# Patient Record
Sex: Male | Born: 1960
Health system: Southern US, Community
[De-identification: ages and names within clinical notes are randomized; demographics above are authoritative.]

## PROBLEM LIST (undated history)

## (undated) DIAGNOSIS — Z8711 Personal history of peptic ulcer disease: Secondary | ICD-10-CM

## (undated) DIAGNOSIS — Q2381 Bicuspid aortic valve: Secondary | ICD-10-CM

## (undated) DIAGNOSIS — K219 Gastro-esophageal reflux disease without esophagitis: Secondary | ICD-10-CM

## (undated) DIAGNOSIS — I10 Essential (primary) hypertension: Secondary | ICD-10-CM

## (undated) DIAGNOSIS — Q231 Congenital insufficiency of aortic valve: Secondary | ICD-10-CM

## (undated) DIAGNOSIS — R197 Diarrhea, unspecified: Secondary | ICD-10-CM

## (undated) DIAGNOSIS — M1990 Unspecified osteoarthritis, unspecified site: Secondary | ICD-10-CM

## (undated) DIAGNOSIS — J449 Chronic obstructive pulmonary disease, unspecified: Secondary | ICD-10-CM

## (undated) DIAGNOSIS — R112 Nausea with vomiting, unspecified: Secondary | ICD-10-CM

## (undated) DIAGNOSIS — I456 Pre-excitation syndrome: Secondary | ICD-10-CM

## (undated) DIAGNOSIS — Z9889 Other specified postprocedural states: Secondary | ICD-10-CM

## (undated) DIAGNOSIS — B079 Viral wart, unspecified: Secondary | ICD-10-CM

## (undated) DIAGNOSIS — Z8719 Personal history of other diseases of the digestive system: Secondary | ICD-10-CM

## (undated) DIAGNOSIS — R918 Other nonspecific abnormal finding of lung field: Secondary | ICD-10-CM

## (undated) DIAGNOSIS — M1731 Unilateral post-traumatic osteoarthritis, right knee: Principal | ICD-10-CM

## (undated) DIAGNOSIS — Z87442 Personal history of urinary calculi: Secondary | ICD-10-CM

## (undated) HISTORY — PX: KNEE ARTHROSCOPY: SHX127

## (undated) HISTORY — PX: CERVICAL SPINE SURGERY: SHX589

## (undated) HISTORY — PX: CARDIAC ELECTROPHYSIOLOGY MAPPING AND ABLATION: SHX1292

## (undated) HISTORY — PX: HERNIA REPAIR: SHX51

## (undated) HISTORY — PX: ANTERIOR CERVICAL DECOMP/DISCECTOMY FUSION: SHX1161

## (undated) HISTORY — PX: BACK SURGERY: SHX140

## (undated) HISTORY — PX: JOINT REPLACEMENT: SHX530

## (undated) HISTORY — PX: LAPAROSCOPIC CHOLECYSTECTOMY: SUR755

## (undated) HISTORY — PX: SHOULDER ARTHROSCOPY: SHX128

## (undated) HISTORY — PX: CYSTOSCOPY WITH URETEROSCOPY, STONE BASKETRY AND STENT PLACEMENT: SHX6378

## (undated) HISTORY — PX: APPENDECTOMY: SHX54

---

## 2004-03-14 ENCOUNTER — Encounter: Admission: RE | Admit: 2004-03-14 | Discharge: 2004-03-14 | Payer: Self-pay | Admitting: Orthopedic Surgery

## 2009-02-27 ENCOUNTER — Emergency Department (HOSPITAL_BASED_OUTPATIENT_CLINIC_OR_DEPARTMENT_OTHER): Admission: EM | Admit: 2009-02-27 | Discharge: 2009-02-27 | Payer: Self-pay | Admitting: Emergency Medicine

## 2009-02-27 ENCOUNTER — Ambulatory Visit: Payer: Self-pay | Admitting: Radiology

## 2009-08-21 ENCOUNTER — Emergency Department (HOSPITAL_BASED_OUTPATIENT_CLINIC_OR_DEPARTMENT_OTHER): Admission: EM | Admit: 2009-08-21 | Discharge: 2009-08-21 | Payer: Self-pay | Admitting: Emergency Medicine

## 2009-08-21 ENCOUNTER — Ambulatory Visit: Payer: Self-pay | Admitting: Diagnostic Radiology

## 2010-06-24 LAB — CBC
HCT: 49.7 % (ref 39.0–52.0)
Hemoglobin: 17.2 g/dL — ABNORMAL HIGH (ref 13.0–17.0)
MCHC: 34.7 g/dL (ref 30.0–36.0)
MCV: 95.1 fL (ref 78.0–100.0)
Platelets: 241 10*3/uL (ref 150–400)
RBC: 5.22 MIL/uL (ref 4.22–5.81)
RDW: 12.5 % (ref 11.5–15.5)
WBC: 8.6 10*3/uL (ref 4.0–10.5)

## 2010-06-24 LAB — DIFFERENTIAL
Basophils Absolute: 0.1 10*3/uL (ref 0.0–0.1)
Basophils Relative: 1 % (ref 0–1)
Eosinophils Absolute: 0.1 10*3/uL (ref 0.0–0.7)
Eosinophils Relative: 1 % (ref 0–5)
Lymphocytes Relative: 24 % (ref 12–46)
Lymphs Abs: 2.1 10*3/uL (ref 0.7–4.0)
Monocytes Absolute: 0.8 10*3/uL (ref 0.1–1.0)
Monocytes Relative: 9 % (ref 3–12)
Neutro Abs: 5.5 10*3/uL (ref 1.7–7.7)
Neutrophils Relative %: 64 % (ref 43–77)

## 2010-06-24 LAB — PROTIME-INR
INR: 0.98 (ref 0.00–1.49)
Prothrombin Time: 12.9 seconds (ref 11.6–15.2)

## 2010-06-24 LAB — COMPREHENSIVE METABOLIC PANEL
ALT: 36 U/L (ref 0–53)
AST: 26 U/L (ref 0–37)
Albumin: 4.9 g/dL (ref 3.5–5.2)
Alkaline Phosphatase: 82 U/L (ref 39–117)
BUN: 17 mg/dL (ref 6–23)
CO2: 26 mEq/L (ref 19–32)
Calcium: 9.6 mg/dL (ref 8.4–10.5)
Chloride: 105 mEq/L (ref 96–112)
Creatinine, Ser: 1.3 mg/dL (ref 0.4–1.5)
GFR calc Af Amer: >60 mL/min (ref >60)
GFR calc non Af Amer: 59 mL/min — ABNORMAL LOW (ref >60)
Glucose, Bld: 107 mg/dL — ABNORMAL HIGH (ref 70–99)
Potassium: 4.1 mEq/L (ref 3.5–5.1)
Sodium: 146 mEq/L — ABNORMAL HIGH (ref 135–145)
Total Bilirubin: 1.6 mg/dL — ABNORMAL HIGH (ref 0.3–1.2)
Total Protein: 8.6 g/dL — ABNORMAL HIGH (ref 6.0–8.3)

## 2010-06-24 LAB — POCT CARDIAC MARKERS
CKMB, poc: 1 ng/mL (ref 1.0–8.0)
CKMB, poc: 1.6 ng/mL (ref 1.0–8.0)
Myoglobin, poc: 108 ng/mL (ref 12–200)
Myoglobin, poc: 97.8 ng/mL (ref 12–200)
Troponin i, poc: <0.05 ng/mL (ref 0.00–0.09)
Troponin i, poc: <0.05 ng/mL (ref 0.00–0.09)

## 2010-06-24 LAB — APTT: aPTT: 29 seconds (ref 24–37)

## 2014-06-07 ENCOUNTER — Institutional Professional Consult (permissible substitution): Payer: Self-pay | Admitting: Internal Medicine

## 2015-07-29 ENCOUNTER — Ambulatory Visit (HOSPITAL_COMMUNITY)
Admission: RE | Admit: 2015-07-29 | Discharge: 2015-07-29 | Disposition: A | Discharge status: Home / Self Care | Payer: BLUE CROSS/BLUE SHIELD | Source: Ambulatory Visit | Attending: Orthopedic Surgery | Admitting: Orthopedic Surgery

## 2015-07-29 ENCOUNTER — Other Ambulatory Visit (HOSPITAL_COMMUNITY): Payer: Self-pay | Admitting: Orthopedic Surgery

## 2015-07-29 DIAGNOSIS — M79604 Pain in right leg: Secondary | ICD-10-CM | POA: Diagnosis present

## 2015-07-29 DIAGNOSIS — R609 Edema, unspecified: Secondary | ICD-10-CM

## 2015-07-29 DIAGNOSIS — M7989 Other specified soft tissue disorders: Secondary | ICD-10-CM | POA: Diagnosis not present

## 2015-07-29 NOTE — Progress Notes (Signed)
VASCULAR LAB PRELIMINARY  PRELIMINARY  PRELIMINARY  PRELIMINARY  Right lower extremity venous duplex completed.    Preliminary report:  Right:  No evidence of DVT, superficial thrombosis, or Baker's cyst.  Adam Watson, RVT 07/29/2015, 5:11 PM

## 2016-04-07 ENCOUNTER — Encounter (HOSPITAL_COMMUNITY): Payer: Self-pay

## 2016-04-07 ENCOUNTER — Encounter (HOSPITAL_COMMUNITY)
Admission: RE | Admit: 2016-04-07 | Discharge: 2016-04-07 | Disposition: A | Discharge status: Home / Self Care | Payer: BLUE CROSS/BLUE SHIELD | Source: Ambulatory Visit | Attending: Orthopedic Surgery | Admitting: Orthopedic Surgery

## 2016-04-07 ENCOUNTER — Encounter: Payer: Self-pay | Admitting: Physician Assistant

## 2016-04-07 DIAGNOSIS — I252 Old myocardial infarction: Secondary | ICD-10-CM | POA: Diagnosis not present

## 2016-04-07 DIAGNOSIS — J449 Chronic obstructive pulmonary disease, unspecified: Secondary | ICD-10-CM | POA: Diagnosis present

## 2016-04-07 DIAGNOSIS — R918 Other nonspecific abnormal finding of lung field: Secondary | ICD-10-CM | POA: Diagnosis not present

## 2016-04-07 DIAGNOSIS — I10 Essential (primary) hypertension: Secondary | ICD-10-CM | POA: Insufficient documentation

## 2016-04-07 DIAGNOSIS — I498 Other specified cardiac arrhythmias: Secondary | ICD-10-CM | POA: Insufficient documentation

## 2016-04-07 DIAGNOSIS — Z01812 Encounter for preprocedural laboratory examination: Secondary | ICD-10-CM | POA: Diagnosis not present

## 2016-04-07 DIAGNOSIS — M1731 Unilateral post-traumatic osteoarthritis, right knee: Secondary | ICD-10-CM | POA: Insufficient documentation

## 2016-04-07 DIAGNOSIS — K219 Gastro-esophageal reflux disease without esophagitis: Secondary | ICD-10-CM | POA: Insufficient documentation

## 2016-04-07 DIAGNOSIS — Z01810 Encounter for preprocedural cardiovascular examination: Secondary | ICD-10-CM | POA: Insufficient documentation

## 2016-04-07 DIAGNOSIS — R112 Nausea with vomiting, unspecified: Secondary | ICD-10-CM | POA: Insufficient documentation

## 2016-04-07 DIAGNOSIS — Z9889 Other specified postprocedural states: Secondary | ICD-10-CM | POA: Diagnosis present

## 2016-04-07 HISTORY — DX: Essential (primary) hypertension: I10

## 2016-04-07 HISTORY — DX: Other specified postprocedural states: Z98.890

## 2016-04-07 HISTORY — DX: Chronic obstructive pulmonary disease, unspecified: J44.9

## 2016-04-07 HISTORY — DX: Unspecified osteoarthritis, unspecified site: M19.90

## 2016-04-07 HISTORY — DX: Diarrhea, unspecified: R19.7

## 2016-04-07 HISTORY — DX: Gastro-esophageal reflux disease without esophagitis: K21.9

## 2016-04-07 HISTORY — DX: Other nonspecific abnormal finding of lung field: R91.8

## 2016-04-07 HISTORY — DX: Unilateral post-traumatic osteoarthritis, right knee: M17.31

## 2016-04-07 HISTORY — DX: Nausea with vomiting, unspecified: R11.2

## 2016-04-07 LAB — CBC
HCT: 45.9 % (ref 39.0–52.0)
Hemoglobin: 16.2 g/dL (ref 13.0–17.0)
MCH: 32.5 pg (ref 26.0–34.0)
MCHC: 35.3 g/dL (ref 30.0–36.0)
MCV: 92.2 fL (ref 78.0–100.0)
Platelets: 284 10*3/uL (ref 150–400)
RBC: 4.98 MIL/uL (ref 4.22–5.81)
RDW: 13.5 % (ref 11.5–15.5)
WBC: 6.9 10*3/uL (ref 4.0–10.5)

## 2016-04-07 LAB — BASIC METABOLIC PANEL
Anion gap: 8 (ref 5–15)
BUN: 10 mg/dL (ref 6–20)
CO2: 26 mmol/L (ref 22–32)
Calcium: 9.6 mg/dL (ref 8.9–10.3)
Chloride: 105 mmol/L (ref 101–111)
Creatinine, Ser: 1.08 mg/dL (ref 0.61–1.24)
GFR calc Af Amer: >60 mL/min (ref >60)
GFR calc non Af Amer: >60 mL/min (ref >60)
Glucose, Bld: 108 mg/dL — ABNORMAL HIGH (ref 65–99)
Potassium: 3.9 mmol/L (ref 3.5–5.1)
Sodium: 139 mmol/L (ref 135–145)

## 2016-04-07 LAB — SURGICAL PCR SCREEN
MRSA, PCR: NEGATIVE
Staphylococcus aureus: NEGATIVE

## 2016-04-07 NOTE — Progress Notes (Signed)
(  disregard previous note)

## 2016-04-07 NOTE — Pre-Procedure Instructions (Signed)
    Adam Watson  04/07/2016     No Pharmacies Listed   Your procedure is scheduled on 04/13/16.  [facility] at 530 A.M.  Call this number if you have problems the morning of surgery:  [phone]   Remember:  Do not eat food or drink liquids after midnight.  Take these medicines the morning of surgery with A SIP OF WATER --    Do not wear jewelry, make-up or nail polish.  Do not wear lotions, powders, or perfumes, or deoderant.  Do not shave 48 hours prior to surgery.  Men may shave face and neck.  Do not bring valuables to the hospital.  [facility] is not responsible for any belongings or valuables.  Contacts, dentures or bridgework may not be worn into surgery.  Leave your suitcase in the car.  After surgery it may be brought to your room.  For patients admitted to the hospital, discharge time will be determined by your treatment team.  Patients discharged the day of surgery will not be allowed to drive home.   Name and phone number of your driver:   Special instructions: Do not take any aspirin,anti-inflammatories,vitamins,or herbal supplements 5-7 days prior to surgery.  Please read over the following fact sheets that you were given. MRSA Information

## 2016-04-07 NOTE — Progress Notes (Signed)
Pt is going to call Dr Marquis BuggyVang [place] to report elevated BP. 134/106

## 2016-04-07 NOTE — H&P (Signed)
TOTAL KNEE ADMISSION H&P  Patient is being admitted for right total knee arthroplasty.  Subjective:  Chief Complaint:right knee pain.  HPI [name] is a 56 year-old seen for follow up evaluation from his persistent significant right knee pain.  He is 7 months status post repeat right knee arthroscopy for partial medial meniscectomy.  He continues to have significant pain medially.  He underwent an IME evaluation by Dr. Beverely Low on February 11, 2016 who felt that he may be a candidate for a unicompartmental versus total knee arthroplasty due to his posttraumatic osteoarthritis.  It has been difficult for him to work and he has continued to have pain.     Adam Watson, 56 y.o. male, has a history of pain and functional disability in the right knee due to trauma and has failed non-surgical conservative treatments for greater than 12 weeks to includeNSAID's and/or analgesics, corticosteriod injections, viscosupplementation injections, flexibility and strengthening excercises, supervised PT with diminished ADL's post treatment, use of assistive devices and activity modification.  Onset of symptoms was abrupt, starting 1 years ago with rapidlly worsening course since that time. The patient noted prior procedures on the knee to include  arthroscopy and menisectomy on the right knee(s).  Patient currently rates pain in the right knee(s) at 10 out of 10 with activity. Patient has night pain, worsening of pain with activity and weight bearing, pain that interferes with activities of daily living, crepitus and joint swelling.  Patient has evidence of subchondral sclerosis, periarticular osteophytes and joint space narrowing by imaging studies.There is no active infection.  Patient Active Problem List   Diagnosis Date Noted  . Post-traumatic osteoarthritis of right knee 04/07/2016  . Pulmonary nodules   . PONV (postoperative nausea and vomiting)   . Hypertension   . GERD (gastroesophageal reflux disease)    . COPD (chronic obstructive pulmonary disease) (HCC)    Past Medical History:  Diagnosis Date  . Arthritis   . COPD (chronic obstructive pulmonary disease) (HCC)   . Diarrhea    hx  . GERD (gastroesophageal reflux disease)   . Hypertension   . Lesion of ala of nose   . PONV (postoperative nausea and vomiting)   . Post-traumatic osteoarthritis of right knee 04/07/2016  . Pulmonary nodules     Past Surgical History:  Procedure Laterality Date  . APPENDECTOMY    . BACK SURGERY     X2     . CHOLECYSTECTOMY    . JOINT REPLACEMENT     scopes x2    No current facility-administered medications for this encounter.   Current Outpatient Prescriptions:  .  losartan-hydrochlorothiazide (HYZAAR) 50-12.5 MG tablet, Take 1 tablet by mouth daily., Disp: , Rfl:  .  Multiple Vitamins-Minerals (MULTIVITAMIN PO), Take 1 tablet by mouth daily., Disp: , Rfl:  .  mupirocin ointment (BACTROBAN) 2 %, Place 1 application into the nose 2 (two) times daily., Disp: , Rfl: 0 .  omeprazole (PRILOSEC) 40 MG capsule, Take 40 mg by mouth daily., Disp: , Rfl: 2 .  potassium chloride (K-DUR,KLOR-CON) 10 MEQ tablet, Take 10 mEq by mouth 2 (two) times daily., Disp: , Rfl:     Allergies  Allergen Reactions  . Codeine Other (See Comments)    Eyes burning  . Penicillins Hives and Rash    Social History  Substance Use Topics  . Smoking status: Never Smoker  . Smokeless tobacco: Not on file  . Alcohol use No    No family history on file.  Review of Systems  Constitutional: Negative.   HENT: Negative.   Eyes: Negative.   Respiratory: Negative.   Cardiovascular: Negative.   Gastrointestinal: Negative.   Genitourinary: Negative.   Musculoskeletal: Positive for back pain and joint pain.  Skin: Negative.   Neurological: Negative.   Endo/Heme/Allergies: Negative.   Psychiatric/Behavioral: Negative.     Objective:  Physical Exam  Constitutional: He is oriented to person, place, and time. He appears  well-developed and well-nourished.  HENT:  Head: Normocephalic and atraumatic.  Mouth/Throat: Oropharynx is clear and moist.  Eyes: Conjunctivae are normal. Pupils are equal, round, and reactive to light.  Neck: Neck supple.  Cardiovascular: Normal rate and regular rhythm.   Respiratory: Effort normal and breath sounds normal.  GI: Soft. Bowel sounds are normal.  Genitourinary:  Genitourinary Comments: Not pertinent to current symptomatology therefore not examined.  Musculoskeletal:  Examination of his right knee reveals pain medially.  1+ synovitis.  Well healed arthroscopic portals.  Range of motion is from 0-120 degrees.  Knee is stable with normal patella tracking.  Examination of the left knee reveals full range of motion without pain, swelling, weakness or instability.    Neurological: He is alert and oriented to person, place, and time.  Skin: Skin is warm and dry.  Psychiatric: He has a normal mood and affect. His behavior is normal.    Vital signs in last 24 hours: Temp:  [98.5 F (36.9 C)] 98.5 F (36.9 C) (01/16 0958) Pulse Rate:  [100] 100 (01/16 0958) Resp:  [18] 18 (01/16 0958) BP: (134)/(96) 134/96 (01/16 0958) SpO2:  [96 %] 96 % (01/16 0958) Weight:  [117.2 kg (258 lb 5 oz)] 117.2 kg (258 lb 5 oz) (01/16 0958)  Labs:   Estimated body mass index is 34.08 kg/m as calculated from the following:   Height as of 04/07/16: 6\' 1"  (1.854 m).   Weight as of 04/07/16: 117.2 kg (258 lb 5 oz).   Imaging Review Plain radiographs demonstrate severe degenerative joint disease of the right knee(s). The overall alignment issignificant varus. The bone quality appears to be good for age and reported activity level.  Assessment/Plan:  End stage arthritis, right knee  Principal Problem:   Post-traumatic osteoarthritis of right knee Active Problems:   Pulmonary nodules   PONV (postoperative nausea and vomiting)   Hypertension   GERD (gastroesophageal reflux disease)   COPD  (chronic obstructive pulmonary disease) (HCC)   The patient history, physical examination, clinical judgment of the provider and imaging studies are consistent with end stage degenerative joint disease of the right knee(s) and total knee arthroplasty is deemed medically necessary. The treatment options including medical management, injection therapy arthroscopy and arthroplasty were discussed at length. The risks and benefits of total knee arthroplasty were presented and reviewed. The risks due to aseptic loosening, infection, stiffness, patella tracking problems, thromboembolic complications and other imponderables were discussed. The patient acknowledged the explanation, agreed to proceed with the plan and consent was signed. Patient is being admitted for inpatient treatment for surgery, pain control, PT, OT, prophylactic antibiotics, VTE prophylaxis, progressive ambulation and ADL's and discharge planning. The patient is planning to be discharged home with home health services

## 2016-04-09 ENCOUNTER — Encounter (HOSPITAL_COMMUNITY): Payer: Self-pay | Admitting: Emergency Medicine

## 2016-04-09 NOTE — Progress Notes (Addendum)
Anesthesia Chart Review:  Pt is a 56 year old male scheduled for R unicompartmental knee on 04/13/2016 with Salvatore Marvelobert Wainer, MD.   - PCP is Derek JackAldene Hawks, MD who cleared pt for surgery at last office visit 04/02/16 (notes in care everywhere) - Sees Lysle PearlJustin Blaylock, PA with pulmonology (notes in care everywhere)  PMH includes:  HTN, COPD, pulmonary nodules, post-op N/V, GERD.  Never smoker. BMI 34  Medications include: losartan-hctz, prilosec, potassium  Preoperative labs reviewed.    EKG 04/07/16: NSR with sinus arrhythmia. Inferior infarct, age undetermined.  Appears stable when compared to EKG 02/27/09.   Per Dr. Sherene SiresWainer's office, pt had ablation for WPW ~15 years ago, has not seen cardiology since. Pt also reports he had stress test/cardiac work up with PCP ~1.5 years ago. Attempting to get records.   Rica Mastngela Carla Rashad, FNP-BC [facility]Anesthesiology Phone: [phone] 04/09/2016 4:12 PM  Addendum:  Neither PCP's current office nor [facility] (PCP's prior place of work) have records of pt undergoing any cardiac testing.    Most recently dated progress note from [facility] dated 12/22/10 documents pt has aortic stenosis with bicuspid valve and WP[facility].    I spoke with Genelle BalKirsten Shepperson, PA in Dr. Sherene SiresWainer's office. Given significant cardiac history that appears to be not to be followed by cardiology, pt will need cardiology eval prior to surgery.   Rica Mastngela Hillman Attig, FNP-BC [facility] Phone: [phone] 04/10/2016 2:56 PM

## 2016-04-10 MED ORDER — VANCOMYCIN HCL 10 G IV SOLR
1500.0000 mg | INTRAVENOUS | Status: DC
  Filled 2016-04-10: qty 1500

## 2016-04-11 NOTE — Progress Notes (Signed)
Cardiology Office Note   Date:  04/13/2016   ID:  Adam MoraleJohn R Poss, DOB 03/16/1961, MRN [id number]  PCP:  No PCP Per Patient  Cardiologist:   Rollene RotundaJames Timaya Bojarski, MD  Referring:  Dr. Thurston HoleWainer  Chief Complaint  Patient presents with  . Pre-op Exam  . Aortic Valve Disease      History of Present Illness: Adam Watson is a 56 y.o. male who presents for preop clearance prior to knee surgery.   A progress note from [facility] dated 12/22/10 documents pt has aortic stenosis with bicuspid valve and WPW history [facility].  He confirms this.  He says he was getting echos every five years.  He had problems with his BP and had a stress test two years ago.  He also had an echo.  These apparently were both unremarkable except for a documented bicuspid aortic valve.  The patient denies any new symptoms such as chest discomfort, neck or arm discomfort. There has been no new shortness of breath, PND or orthopnea. There have been no reported palpitations, presyncope or syncope.  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     Past Medical History:  Diagnosis Date  . Arthritis   . COPD (chronic obstructive pulmonary disease) (HCC)   . Diarrhea    hx  . GERD (gastroesophageal reflux disease)   . Hypertension    no meds  . Lesion of ala of nose   . PONV (postoperative nausea and vomiting)   . Post-traumatic osteoarthritis of right knee 04/07/2016  . Pulmonary nodules     Past Surgical History:  Procedure Laterality Date  . APPENDECTOMY    . CHOLECYSTECTOMY    . Knee pain     scopes x2  . NECK SURGERY     X2        Current Outpatient Prescriptions  Medication Sig Dispense Refill  . losartan-hydrochlorothiazide (HYZAAR) 50-12.5 MG tablet Take 1 tablet by mouth daily.    . Multiple Vitamins-Minerals (MULTIVITAMIN PO) Take 1 tablet by mouth daily.    . mupirocin ointment (BACTROBAN) 2 % Place 1 application into the nose 2 (two) times daily.  0  .  omeprazole (PRILOSEC) 40 MG capsule Take 40 mg by mouth daily.  2  . potassium chloride (K-DUR,KLOR-CON) 10 MEQ tablet Take 10 mEq by mouth 2 (two) times daily.     No current facility-administered medications for this visit.     Allergies:   Codeine and Penicillins    Social History:  The patient  reports that he has never smoked. He has never used smokeless tobacco. He reports that he does not drink alcohol or use drugs.   Family History:  The patient's family history includes Cancer in his father; Diverticulitis in his brother; Heart attack in his brother; Hypertension in his brother and mother; Kidney failure in his mother; Lupus in his mother.    ROS:  Please see the history of present illness.   Otherwise, review of systems are positive for none.   All other systems are reviewed and negative.    PHYSICAL EXAM: VS:  BP 138/88 (BP Location: Left Arm, Patient Position: Sitting, Cuff Size: Large)   Pulse (!) 106   Ht 6\' 1"  (1.854 m)   Wt 260 lb (117.9 kg)   BMI 34.30 kg/m  , BMI Body mass index is 34.3 kg/m. GENERAL:  Well appearing HEENT:  Pupils equal round and reactive, fundi  not visualized, oral mucosa unremarkable NECK:  No jugular venous distention, waveform within normal limits, carotid upstroke brisk and symmetric, no bruits, no thyromegaly LYMPHATICS:  No cervical, inguinal adenopathy LUNGS:  Clear to auscultation bilaterally BACK:  No CVA tenderness CHEST:  Unremarkable HEART:  PMI not displaced or sustained,S1 and S2 within normal limits, no S3, no S4, no clicks, no rubs, soft apical early peaking nonradiating systolic murmur, no diastolic murmurs ABD:  Flat, positive bowel sounds normal in frequency in pitch, no bruits, no rebound, no guarding, no midline pulsatile mass, no hepatomegaly, no splenomegaly EXT:  2 plus pulses throughout, no edema, no cyanosis no clubbing SKIN:  No rashes no nodules NEURO:  Cranial nerves II through XII grossly intact, motor grossly  intact throughout PSYCH:  Cognitively intact, oriented to person place and time   EKG:  EKG is ordered today. The ekg ordered today demonstrates sinus tachycardia, rate 106, axis within normal limits, intervals within normal limits, low voltage borderline on the chest leads, poor anterior R wave progression   Recent Labs: 04/07/2016: BUN 10; Creatinine, Ser 1.08; Hemoglobin 16.2; Platelets 284; Potassium 3.9; Sodium 139    Lipid Panel No results found for: CHOL, TRIG, HDL, CHOLHDL, VLDL, LDLCALC, LDLDIRECT    Wt Readings from Last 3 Encounters:  04/13/16 260 lb (117.9 kg)  04/07/16 258 lb 5 oz (117.2 kg)      Other studies Reviewed: Additional studies/ records that were reviewed today include: Care Everywhere records. . Review of the above records demonstrates:  Please see elsewhere in the note.     ASSESSMENT AND PLAN:   PREOP CLEARANCE:    I would like to see the echocardiogram and stress test that he had done in 2016 at [facility].  However, we were not able to find these and the patient was not absolutely sure that they were done.  Therefore, he will need a repeat echo prior to cardiology clearance.   WPW:  He has had no further palpitations.  No further therapy is indicated.    BICUSPID AORTIC VALVE:     He has no symptoms related to this. We talked at length about the physiology. I don't suspect he'll need any further imaging but I'll see him back in a couple of years to follow this clinically.    OBESITY:   We had a long discussion about this with therapeutic lifestyle changes.  Current medicines are reviewed at length with the patient today.  The patient does not have concerns regarding medicines.  The following changes have been made:  no change  Labs/ tests ordered today include:   Orders Placed This Encounter  Procedures  . EKG 12-Lead  . ECHOCARDIOGRAM COMPLETE     Disposition:   FU with me in two years with an echo.     Signed, Rollene Rotunda, MD    04/13/2016 12:39 PM    [facility]

## 2016-04-13 ENCOUNTER — Encounter: Payer: Self-pay | Admitting: Cardiology

## 2016-04-13 ENCOUNTER — Encounter (HOSPITAL_COMMUNITY): Admission: RE | Payer: Self-pay | Source: Ambulatory Visit

## 2016-04-13 ENCOUNTER — Ambulatory Visit (INDEPENDENT_AMBULATORY_CARE_PROVIDER_SITE_OTHER): Payer: Worker's Compensation | Admitting: Cardiology

## 2016-04-13 ENCOUNTER — Inpatient Hospital Stay (HOSPITAL_COMMUNITY)
Admission: RE | Admit: 2016-04-13 | Payer: Worker's Compensation | Source: Ambulatory Visit | Admitting: Orthopedic Surgery

## 2016-04-13 VITALS — BP 138/88 | HR 106 | Ht 73.0 in | Wt 260.0 lb

## 2016-04-13 DIAGNOSIS — Z01818 Encounter for other preprocedural examination: Secondary | ICD-10-CM

## 2016-04-13 DIAGNOSIS — Q231 Congenital insufficiency of aortic valve: Secondary | ICD-10-CM | POA: Diagnosis not present

## 2016-04-13 DIAGNOSIS — I456 Pre-excitation syndrome: Secondary | ICD-10-CM | POA: Diagnosis not present

## 2016-04-13 HISTORY — DX: Unilateral post-traumatic osteoarthritis, right knee: M17.31

## 2016-04-13 SURGERY — ARTHROPLASTY, KNEE, UNICOMPARTMENTAL
Anesthesia: General | Laterality: Right

## 2016-04-13 NOTE — Patient Instructions (Addendum)
Medication Instructions:  Continue current medications  Labwork: None Ordered  Testing/Procedures: Your physician has requested that you have an echocardiogram. Echocardiography is a painless test that uses sound waves to create images of your heart. It provides your doctor with information about the size and shape of your heart and how well your heart's chambers and valves are working. This procedure takes approximately one hour. There are no restrictions for this procedure.   Follow-Up: Your physician wants you to follow-up in: 2 Year. You will receive a reminder letter in the mail two months in advance. If you don't receive a letter, please call our office to schedule the follow-up appointment.   Any Other Special Instructions Will Be Listed Below (If Applicable).   If you need a refill on your cardiac medications before your next appointment, please call your pharmacy.   

## 2016-04-15 ENCOUNTER — Ambulatory Visit (HOSPITAL_COMMUNITY): Payer: Worker's Compensation | Attending: Cardiology

## 2016-04-15 ENCOUNTER — Other Ambulatory Visit: Payer: Self-pay

## 2016-04-15 DIAGNOSIS — J449 Chronic obstructive pulmonary disease, unspecified: Secondary | ICD-10-CM | POA: Diagnosis not present

## 2016-04-15 DIAGNOSIS — Z01818 Encounter for other preprocedural examination: Secondary | ICD-10-CM

## 2016-04-15 DIAGNOSIS — I10 Essential (primary) hypertension: Secondary | ICD-10-CM | POA: Diagnosis not present

## 2016-04-15 DIAGNOSIS — Q231 Congenital insufficiency of aortic valve: Secondary | ICD-10-CM | POA: Diagnosis not present

## 2016-04-15 DIAGNOSIS — I071 Rheumatic tricuspid insufficiency: Secondary | ICD-10-CM | POA: Insufficient documentation

## 2016-04-15 NOTE — H&P (Signed)
TOTAL KNEE ADMISSION H&P  Patient is being admitted for right knee arthroscopy uni vs total knee arthroplasty.  Subjective:  Chief Complaint:right knee pain.  HPI Mr. Kristen LoaderFurr is a 56 year-old seen for follow up evaluation from his persistent significant right knee pain.  He is 7 months status post repeat right knee arthroscopy for partial medial meniscectomy.  He continues to have significant pain medially.  He underwent an IME evaluation by Dr. Beverely LowSteve Norris on February 11, 2016 who felt that he may be a candidate for a unicompartmental versus total knee arthroplasty due to his posttraumatic osteoarthritis.  It has been difficult for him to work and he has continued to have pain.     Arnoldo MoraleJohn R Moccia, 56 y.o. male, has a history of pain and functional disability in the right knee due to trauma and has failed non-surgical conservative treatments for greater than 12 weeks to includeNSAID's and/or analgesics, corticosteriod injections, viscosupplementation injections, flexibility and strengthening excercises, supervised PT with diminished ADL's post treatment, use of assistive devices and activity modification.  Onset of symptoms was abrupt, starting 1 years ago with rapidlly worsening course since that time. The patient noted prior procedures on the knee to include  arthroscopy and menisectomy on the right knee(s).  Patient currently rates pain in the right knee(s) at 10 out of 10 with activity. Patient has night pain, worsening of pain with activity and weight bearing, pain that interferes with activities of daily living, crepitus and joint swelling.  Patient has evidence of subchondral sclerosis, periarticular osteophytes and joint space narrowing by imaging studies.There is no active infection.      Patient Active Problem List   Diagnosis Date Noted  . Post-traumatic osteoarthritis of right knee 04/07/2016  . Pulmonary nodules   . PONV (postoperative nausea and vomiting)   . Hypertension   .  GERD (gastroesophageal reflux disease)   . COPD (chronic obstructive pulmonary disease) (HCC)        Past Medical History:  Diagnosis Date  . Arthritis   . COPD (chronic obstructive pulmonary disease) (HCC)   . Diarrhea    hx  . GERD (gastroesophageal reflux disease)   . Hypertension   . Lesion of ala of nose   . PONV (postoperative nausea and vomiting)   . Post-traumatic osteoarthritis of right knee 04/07/2016  . Pulmonary nodules          Past Surgical History:  Procedure Laterality Date  . APPENDECTOMY    . BACK SURGERY     X2     . CHOLECYSTECTOMY    . JOINT REPLACEMENT     scopes x2    No current facility-administered medications for this encounter.   Current Outpatient Prescriptions:  .  losartan-hydrochlorothiazide (HYZAAR) 50-12.5 MG tablet, Take 1 tablet by mouth daily., Disp: , Rfl:  .  Multiple Vitamins-Minerals (MULTIVITAMIN PO), Take 1 tablet by mouth daily., Disp: , Rfl:  .  mupirocin ointment (BACTROBAN) 2 %, Place 1 application into the nose 2 (two) times daily., Disp: , Rfl: 0 .  omeprazole (PRILOSEC) 40 MG capsule, Take 40 mg by mouth daily., Disp: , Rfl: 2 .  potassium chloride (K-DUR,KLOR-CON) 10 MEQ tablet, Take 10 mEq by mouth 2 (two) times daily., Disp: , Rfl:          Allergies  Allergen Reactions  . Codeine Other (See Comments)    Eyes burning  . Penicillins Hives and Rash        Social History  Substance Use Topics  .  Smoking status: Never Smoker  . Smokeless tobacco: Not on file  . Alcohol use No    No family history on file.   Review of Systems  Constitutional: Negative.   HENT: Negative.   Eyes: Negative.   Respiratory: Negative.   Cardiovascular: Negative.   Gastrointestinal: Negative.   Genitourinary: Negative.   Musculoskeletal: Positive for back pain and joint pain.  Skin: Negative.   Neurological: Negative.   Endo/Heme/Allergies: Negative.   Psychiatric/Behavioral: Negative.      Objective:  Physical Exam  Constitutional: He is oriented to person, place, and time. He appears well-developed and well-nourished.  HENT:  Head: Normocephalic and atraumatic.  Mouth/Throat: Oropharynx is clear and moist.  Eyes: Conjunctivae are normal. Pupils are equal, round, and reactive to light.  Neck: Neck supple.  Cardiovascular: Normal rate and regular rhythm.   Respiratory: Effort normal and breath sounds normal.  GI: Soft. Bowel sounds are normal.  Genitourinary:  Genitourinary Comments: Not pertinent to current symptomatology therefore not examined.  Musculoskeletal:  Examination of his right knee reveals pain medially.  1+ synovitis.  Well healed arthroscopic portals.  Range of motion is from 0-120 degrees.  Knee is stable with normal patella tracking.  Examination of the left knee reveals full range of motion without pain, swelling, weakness or instability.    Neurological: He is alert and oriented to person, place, and time.  Skin: Skin is warm and dry.  Psychiatric: He has a normal mood and affect. His behavior is normal.    Vital signs in last 24 hours: Temp:  [98.5 F (36.9 C)] 98.5 F (36.9 C) (01/16 0958) Pulse Rate:  [100] 100 (01/16 0958) Resp:  [18] 18 (01/16 0958) BP: (134)/(96) 134/96 (01/16 0958) SpO2:  [96 %] 96 % (01/16 0958) Weight:  [117.2 kg (258 lb 5 oz)] 117.2 kg (258 lb 5 oz) (01/16 0958)  Labs:   Estimated body mass index is 34.08 kg/m as calculated from the following:   Height as of 04/07/16: 6\' 1"  (1.854 m).   Weight as of 04/07/16: 117.2 kg (258 lb 5 oz).   Imaging Review Plain radiographs demonstrate severe degenerative joint disease of the right knee(s). The overall alignment issignificant varus. The bone quality appears to be good for age and reported activity level.  Assessment/Plan:  End stage arthritis, right knee  Principal Problem:   Post-traumatic osteoarthritis of right knee Active Problems:   Pulmonary  nodules   PONV (postoperative nausea and vomiting)   Hypertension   GERD (gastroesophageal reflux disease)   COPD (chronic obstructive pulmonary disease) (HCC)   The patient history, physical examination, clinical judgment of the provider and imaging studies are consistent with end stage degenerative joint disease of the right knee(s) and total knee arthroplasty is deemed medically necessary. The treatment options including medical management, injection therapy arthroscopy and arthroplasty were discussed at length. The risks and benefits of total knee arthroplasty were presented and reviewed. The risks due to aseptic loosening, infection, stiffness, patella tracking problems, thromboembolic complications and other imponderables were discussed. The patient acknowledged the explanation, agreed to proceed with the plan and consent was signed. Patient is being admitted for inpatient treatment for surgery, pain control, PT, OT, prophylactic antibiotics, VTE prophylaxis, progressive ambulation and ADL's and discharge planning. The patient is planning to be discharged home with home health services

## 2016-04-16 ENCOUNTER — Other Ambulatory Visit (HOSPITAL_COMMUNITY): Payer: BLUE CROSS/BLUE SHIELD

## 2016-04-17 MED ORDER — SODIUM CHLORIDE 0.9 % IV SOLN
1500.0000 mg | INTRAVENOUS | Status: AC
  Administered 2016-04-20: 1500 mg via INTRAVENOUS
  Filled 2016-04-17: qty 1500

## 2016-04-17 NOTE — Progress Notes (Addendum)
Anesthesia Chart Review:  Pt is a 56 year old male scheduled for R unicompartmental knee on 04/20/2016 with Salvatore Marvelobert Wainer, MD.   Surgery was originally scheduled for 04/13/16 but was postponed so pt could be evaluated by cardiology for hx of WPW and bicuspid aortic valve with stenosis.    See my note dated 04/07/16 for details of history and PAT testing results.   Pt was seen 04/13/16 by Rollene RotundaJames Hochrein, MD and cleared for surgery pending acceptable echo results.   Echo 04/15/16:  - Left ventricle: The cavity size was normal. Wall thickness was normal. Systolic function was normal. The estimated ejection fraction was in the range of 55% to 60%. Features are consistent with a pseudonormal left ventricular filling pattern, with concomitant abnormal relaxation and increased filling pressure (grade 2 diastolic dysfunction). - Aortic valve: Bicuspid; mildly calcified leaflets. There was no stenosis. There was trivial regurgitation. Mean gradient (S): 8 mm Hg. - Aorta: Mildly dilated aortic root. Aortic root dimension: 40 mm (ED). - Mitral valve: Mildly calcified annulus. There was no significant regurgitation. - Right ventricle: The cavity size was normal. Systolic function was normal. - Tricuspid valve: Peak RV-RA gradient (S): 30 mm Hg. - Pulmonary arteries: PA peak pressure: 33 mm Hg (S). - Inferior vena cava: The vessel was normal in size. The respirophasic diameter changes were in the normal range (= 50%), consistent with normal central venous pressure.  If no changes, I anticipate pt can proceed with surgery as scheduled.   Rica Mastngela Francis Yardley, FNP-BC [facility]An[facility]iology Phone: [phone] 04/17/2016 1:04 PM

## 2016-04-20 ENCOUNTER — Inpatient Hospital Stay (HOSPITAL_COMMUNITY): Payer: Worker's Compensation | Admitting: Emergency Medicine

## 2016-04-20 ENCOUNTER — Inpatient Hospital Stay (HOSPITAL_COMMUNITY)
Admission: RE | Admit: 2016-04-20 | Discharge: 2016-04-22 | DRG: 470 | Disposition: A | Discharge status: Home Health | Payer: Worker's Compensation | Source: Ambulatory Visit | Attending: Orthopedic Surgery | Admitting: Orthopedic Surgery

## 2016-04-20 ENCOUNTER — Encounter (HOSPITAL_COMMUNITY): Payer: Self-pay | Admitting: *Deleted

## 2016-04-20 ENCOUNTER — Encounter (HOSPITAL_COMMUNITY)
Admission: RE | Disposition: A | Discharge status: Home Health | Payer: Self-pay | Source: Ambulatory Visit | Attending: Orthopedic Surgery

## 2016-04-20 DIAGNOSIS — Q231 Congenital insufficiency of aortic valve: Secondary | ICD-10-CM

## 2016-04-20 DIAGNOSIS — Z885 Allergy status to narcotic agent status: Secondary | ICD-10-CM | POA: Diagnosis not present

## 2016-04-20 DIAGNOSIS — Z880 Allergy status to penicillin: Secondary | ICD-10-CM | POA: Diagnosis not present

## 2016-04-20 DIAGNOSIS — Z981 Arthrodesis status: Secondary | ICD-10-CM | POA: Diagnosis not present

## 2016-04-20 DIAGNOSIS — Z79899 Other long term (current) drug therapy: Secondary | ICD-10-CM

## 2016-04-20 DIAGNOSIS — I10 Essential (primary) hypertension: Secondary | ICD-10-CM | POA: Diagnosis present

## 2016-04-20 DIAGNOSIS — Z8249 Family history of ischemic heart disease and other diseases of the circulatory system: Secondary | ICD-10-CM | POA: Diagnosis not present

## 2016-04-20 DIAGNOSIS — D62 Acute posthemorrhagic anemia: Secondary | ICD-10-CM | POA: Diagnosis not present

## 2016-04-20 DIAGNOSIS — M1731 Unilateral post-traumatic osteoarthritis, right knee: Principal | ICD-10-CM | POA: Diagnosis present

## 2016-04-20 DIAGNOSIS — R112 Nausea with vomiting, unspecified: Secondary | ICD-10-CM | POA: Diagnosis not present

## 2016-04-20 DIAGNOSIS — R918 Other nonspecific abnormal finding of lung field: Secondary | ICD-10-CM | POA: Diagnosis present

## 2016-04-20 DIAGNOSIS — I2699 Other pulmonary embolism without acute cor pulmonale: Secondary | ICD-10-CM

## 2016-04-20 DIAGNOSIS — R000 Tachycardia, unspecified: Secondary | ICD-10-CM | POA: Diagnosis not present

## 2016-04-20 DIAGNOSIS — K219 Gastro-esophageal reflux disease without esophagitis: Secondary | ICD-10-CM | POA: Diagnosis present

## 2016-04-20 DIAGNOSIS — J449 Chronic obstructive pulmonary disease, unspecified: Secondary | ICD-10-CM | POA: Diagnosis present

## 2016-04-20 DIAGNOSIS — Z9889 Other specified postprocedural states: Secondary | ICD-10-CM | POA: Diagnosis present

## 2016-04-20 HISTORY — DX: Personal history of peptic ulcer disease: Z87.11

## 2016-04-20 HISTORY — PX: PARTIAL KNEE ARTHROPLASTY: SHX2174

## 2016-04-20 HISTORY — DX: Congenital insufficiency of aortic valve: Q23.1

## 2016-04-20 HISTORY — DX: Personal history of other diseases of the digestive system: Z87.19

## 2016-04-20 HISTORY — DX: Bicuspid aortic valve: Q23.81

## 2016-04-20 HISTORY — PX: TOTAL KNEE ARTHROPLASTY: SHX125

## 2016-04-20 HISTORY — DX: Pre-excitation syndrome: I45.6

## 2016-04-20 HISTORY — PX: TOTAL KNEE REVISION: SHX996

## 2016-04-20 HISTORY — DX: Personal history of urinary calculi: Z87.442

## 2016-04-20 HISTORY — DX: Viral wart, unspecified: B07.9

## 2016-04-20 SURGERY — ARTHROPLASTY, KNEE, UNICOMPARTMENTAL
Laterality: Right

## 2016-04-20 MED ORDER — FENTANYL CITRATE (PF) 100 MCG/2ML IJ SOLN
INTRAMUSCULAR | Status: AC
  Filled 2016-04-20: qty 4

## 2016-04-20 MED ORDER — PHENOL 1.4 % MT LIQD: 1.0000 | OROMUCOSAL | Status: DC | PRN

## 2016-04-20 MED ORDER — ROPIVACAINE HCL 7.5 MG/ML IJ SOLN
INTRAMUSCULAR | Status: DC | PRN
  Administered 2016-04-20: 20 mL via PERINEURAL

## 2016-04-20 MED ORDER — EPINEPHRINE PF 1 MG/ML IJ SOLN
INTRAMUSCULAR | Status: AC
  Filled 2016-04-20: qty 1

## 2016-04-20 MED ORDER — APIXABAN 2.5 MG PO TABS: ORAL_TABLET | ORAL | 0 refills | Status: DC

## 2016-04-20 MED ORDER — POVIDONE-IODINE 7.5 % EX SOLN
Freq: Once | CUTANEOUS | Status: DC
  Filled 2016-04-20: qty 118

## 2016-04-20 MED ORDER — DOCUSATE SODIUM 100 MG PO CAPS
100.0000 mg | ORAL_CAPSULE | Freq: Two times a day (BID) | ORAL | Status: DC
  Administered 2016-04-20 – 2016-04-22 (×4): 100 mg via ORAL
  Filled 2016-04-20 (×4): qty 1

## 2016-04-20 MED ORDER — HYDROMORPHONE HCL 1 MG/ML IJ SOLN: 0.2500 mg | INTRAMUSCULAR | Status: DC | PRN

## 2016-04-20 MED ORDER — POTASSIUM CHLORIDE IN NACL 20-0.9 MEQ/L-% IV SOLN
INTRAVENOUS | Status: DC
  Administered 2016-04-20 – 2016-04-21 (×2): via INTRAVENOUS
  Filled 2016-04-20 (×2): qty 1000

## 2016-04-20 MED ORDER — ACETAMINOPHEN 650 MG RE SUPP: 650.0000 mg | Freq: Four times a day (QID) | RECTAL | Status: DC | PRN

## 2016-04-20 MED ORDER — VANCOMYCIN HCL IN DEXTROSE 1-5 GM/200ML-% IV SOLN
1000.0000 mg | Freq: Two times a day (BID) | INTRAVENOUS | Status: AC
  Administered 2016-04-20: 1000 mg via INTRAVENOUS
  Filled 2016-04-20 (×2): qty 200

## 2016-04-20 MED ORDER — DOCUSATE SODIUM 100 MG PO CAPS: ORAL_CAPSULE | ORAL | 0 refills | Status: DC

## 2016-04-20 MED ORDER — HYDROMORPHONE HCL 2 MG PO TABS: ORAL_TABLET | ORAL | 0 refills | Status: DC

## 2016-04-20 MED ORDER — PROMETHAZINE HCL 25 MG/ML IJ SOLN
6.2500 mg | INTRAMUSCULAR | Status: DC | PRN
Start: 2016-04-20 — End: 2016-04-20

## 2016-04-20 MED ORDER — FENTANYL CITRATE (PF) 100 MCG/2ML IJ SOLN
50.0000 ug | Freq: Once | INTRAMUSCULAR | Status: AC
  Administered 2016-04-20: 50 ug via INTRAVENOUS

## 2016-04-20 MED ORDER — CHLORHEXIDINE GLUCONATE 4 % EX LIQD: 60.0000 mL | Freq: Once | CUTANEOUS | Status: DC

## 2016-04-20 MED ORDER — DEXAMETHASONE SODIUM PHOSPHATE 10 MG/ML IJ SOLN
INTRAMUSCULAR | Status: DC | PRN
  Administered 2016-04-20: 10 mg via INTRAVENOUS

## 2016-04-20 MED ORDER — LIDOCAINE 2% (20 MG/ML) 5 ML SYRINGE
INTRAMUSCULAR | Status: AC
  Filled 2016-04-20: qty 5

## 2016-04-20 MED ORDER — MIDAZOLAM HCL 2 MG/2ML IJ SOLN
INTRAMUSCULAR | Status: AC
  Administered 2016-04-20: 1 mg via INTRAVENOUS
  Filled 2016-04-20: qty 2

## 2016-04-20 MED ORDER — METOCLOPRAMIDE HCL 5 MG PO TABS: 5.0000 mg | ORAL_TABLET | Freq: Three times a day (TID) | ORAL | Status: DC | PRN

## 2016-04-20 MED ORDER — MIDAZOLAM HCL 2 MG/2ML IJ SOLN
1.0000 mg | Freq: Once | INTRAMUSCULAR | Status: AC
  Administered 2016-04-20: 1 mg via INTRAVENOUS

## 2016-04-20 MED ORDER — APIXABAN 2.5 MG PO TABS
2.5000 mg | ORAL_TABLET | Freq: Two times a day (BID) | ORAL | Status: DC
  Administered 2016-04-21 – 2016-04-22 (×3): 2.5 mg via ORAL
  Filled 2016-04-20 (×3): qty 1

## 2016-04-20 MED ORDER — MUPIROCIN 2 % EX OINT
1.0000 "application " | TOPICAL_OINTMENT | Freq: Two times a day (BID) | CUTANEOUS | Status: DC
  Administered 2016-04-20 – 2016-04-22 (×4): 1 via NASAL
  Filled 2016-04-20: qty 22

## 2016-04-20 MED ORDER — PHENYLEPHRINE HCL 10 MG/ML IJ SOLN
INTRAVENOUS | Status: DC | PRN
  Administered 2016-04-20: 40 ug/min via INTRAVENOUS

## 2016-04-20 MED ORDER — FENTANYL CITRATE (PF) 100 MCG/2ML IJ SOLN
INTRAMUSCULAR | Status: DC | PRN
  Administered 2016-04-20: 50 ug via INTRAVENOUS

## 2016-04-20 MED ORDER — 0.9 % SODIUM CHLORIDE (POUR BTL) OPTIME
TOPICAL | Status: DC | PRN
  Administered 2016-04-20: 1000 mL

## 2016-04-20 MED ORDER — ALUM & MAG HYDROXIDE-SIMETH 200-200-20 MG/5ML PO SUSP: 30.0000 mL | ORAL | Status: DC | PRN

## 2016-04-20 MED ORDER — MIDAZOLAM HCL 5 MG/5ML IJ SOLN
INTRAMUSCULAR | Status: DC | PRN
  Administered 2016-04-20: 2 mg via INTRAVENOUS

## 2016-04-20 MED ORDER — METOCLOPRAMIDE HCL 5 MG/ML IJ SOLN
5.0000 mg | Freq: Three times a day (TID) | INTRAMUSCULAR | Status: DC | PRN
  Administered 2016-04-20: 10 mg via INTRAVENOUS
  Filled 2016-04-20: qty 2

## 2016-04-20 MED ORDER — PANTOPRAZOLE SODIUM 40 MG PO TBEC
40.0000 mg | DELAYED_RELEASE_TABLET | Freq: Every day | ORAL | Status: DC
  Administered 2016-04-21 – 2016-04-22 (×2): 40 mg via ORAL
  Filled 2016-04-20 (×2): qty 1

## 2016-04-20 MED ORDER — MENTHOL 3 MG MT LOZG: 1.0000 | LOZENGE | OROMUCOSAL | Status: DC | PRN

## 2016-04-20 MED ORDER — DEXAMETHASONE SODIUM PHOSPHATE 10 MG/ML IJ SOLN
INTRAMUSCULAR | Status: AC
  Filled 2016-04-20: qty 1

## 2016-04-20 MED ORDER — BUPIVACAINE HCL (PF) 0.25 % IJ SOLN
INTRAMUSCULAR | Status: DC | PRN
  Administered 2016-04-20: 30 mL

## 2016-04-20 MED ORDER — DIPHENHYDRAMINE HCL 12.5 MG/5ML PO ELIX
12.5000 mg | ORAL_SOLUTION | ORAL | Status: DC | PRN
Start: 2016-04-20 — End: 2016-04-22

## 2016-04-20 MED ORDER — ACETAMINOPHEN 325 MG PO TABS: 650.0000 mg | ORAL_TABLET | Freq: Four times a day (QID) | ORAL | Status: DC | PRN

## 2016-04-20 MED ORDER — PROPOFOL 500 MG/50ML IV EMUL
INTRAVENOUS | Status: DC | PRN
  Administered 2016-04-20: 100 ug/kg/min via INTRAVENOUS
  Administered 2016-04-20: 14:00:00 via INTRAVENOUS

## 2016-04-20 MED ORDER — POLYETHYLENE GLYCOL 3350 17 G PO PACK
17.0000 g | PACK | Freq: Two times a day (BID) | ORAL | Status: DC
  Administered 2016-04-20 – 2016-04-22 (×3): 17 g via ORAL
  Filled 2016-04-20 (×4): qty 1

## 2016-04-20 MED ORDER — MIDAZOLAM HCL 2 MG/2ML IJ SOLN
INTRAMUSCULAR | Status: AC
  Filled 2016-04-20: qty 2

## 2016-04-20 MED ORDER — POLYETHYLENE GLYCOL 3350 17 G PO PACK: PACK | ORAL | 0 refills | Status: DC

## 2016-04-20 MED ORDER — CELECOXIB 200 MG PO CAPS
200.0000 mg | ORAL_CAPSULE | Freq: Two times a day (BID) | ORAL | Status: DC
  Administered 2016-04-20 – 2016-04-22 (×4): 200 mg via ORAL
  Filled 2016-04-20 (×4): qty 1

## 2016-04-20 MED ORDER — LACTATED RINGERS IV SOLN
INTRAVENOUS | Status: DC
  Administered 2016-04-20 (×2): via INTRAVENOUS

## 2016-04-20 MED ORDER — BUPIVACAINE HCL (PF) 0.25 % IJ SOLN
INTRAMUSCULAR | Status: AC
  Filled 2016-04-20: qty 30

## 2016-04-20 MED ORDER — DEXAMETHASONE SODIUM PHOSPHATE 10 MG/ML IJ SOLN
10.0000 mg | Freq: Three times a day (TID) | INTRAMUSCULAR | Status: AC
  Administered 2016-04-20 – 2016-04-21 (×4): 10 mg via INTRAVENOUS
  Filled 2016-04-20 (×4): qty 1

## 2016-04-20 MED ORDER — EPINEPHRINE PF 1 MG/ML IJ SOLN
INTRAMUSCULAR | Status: DC | PRN
  Administered 2016-04-20: 1 mg

## 2016-04-20 MED ORDER — BUPIVACAINE IN DEXTROSE 0.75-8.25 % IT SOLN
INTRATHECAL | Status: DC | PRN
  Administered 2016-04-20: 2 mL via INTRATHECAL

## 2016-04-20 MED ORDER — HYDROMORPHONE HCL 2 MG PO TABS
2.0000 mg | ORAL_TABLET | ORAL | Status: DC | PRN
  Administered 2016-04-20 – 2016-04-22 (×13): 2 mg via ORAL
  Filled 2016-04-20 (×13): qty 1

## 2016-04-20 MED ORDER — SODIUM CHLORIDE 0.9 % IR SOLN
Status: DC | PRN
Start: 2016-04-20 — End: 2016-04-20
  Administered 2016-04-20: 3000 mL

## 2016-04-20 MED ORDER — CEFUROXIME SODIUM 750 MG IJ SOLR
INTRAMUSCULAR | Status: AC
  Filled 2016-04-20: qty 750

## 2016-04-20 MED ORDER — ONDANSETRON HCL 4 MG/2ML IJ SOLN
INTRAMUSCULAR | Status: AC
  Filled 2016-04-20: qty 2

## 2016-04-20 MED ORDER — FENTANYL CITRATE (PF) 100 MCG/2ML IJ SOLN
INTRAMUSCULAR | Status: AC
  Administered 2016-04-20: 50 ug via INTRAVENOUS
  Filled 2016-04-20: qty 2

## 2016-04-20 MED ORDER — ONDANSETRON HCL 4 MG/2ML IJ SOLN
INTRAMUSCULAR | Status: DC | PRN
  Administered 2016-04-20: 4 mg via INTRAVENOUS

## 2016-04-20 MED ORDER — LIDOCAINE 2% (20 MG/ML) 5 ML SYRINGE
INTRAMUSCULAR | Status: DC | PRN
  Administered 2016-04-20: 40 mg via INTRAVENOUS

## 2016-04-20 MED ORDER — ONDANSETRON HCL 4 MG/2ML IJ SOLN
4.0000 mg | Freq: Four times a day (QID) | INTRAMUSCULAR | Status: DC | PRN
  Administered 2016-04-20: 4 mg via INTRAVENOUS
  Filled 2016-04-20: qty 2

## 2016-04-20 MED ORDER — ONDANSETRON HCL 4 MG PO TABS: 4.0000 mg | ORAL_TABLET | Freq: Four times a day (QID) | ORAL | Status: DC | PRN

## 2016-04-20 MED ORDER — MORPHINE SULFATE (PF) 2 MG/ML IV SOLN
2.0000 mg | INTRAVENOUS | Status: DC | PRN
  Administered 2016-04-20: 2 mg via INTRAVENOUS
  Filled 2016-04-20: qty 1

## 2016-04-20 SURGICAL SUPPLY — 69 items
BANDAGE ACE 6X5 VEL STRL LF (GAUZE/BANDAGES/DRESSINGS) ×1 IMPLANT
BANDAGE ESMARK 6X9 LF (GAUZE/BANDAGES/DRESSINGS) ×1 IMPLANT
BLADE SAGITTAL 25.0X1.19X90 (BLADE) ×1 IMPLANT
BLADE SAW SAG HD 89.5X25X.94 (BLADE) ×1 IMPLANT
BLADE SURG 11 STRL SS (BLADE) ×1 IMPLANT
BNDG CMPR 9X6 STRL LF SNTH (GAUZE/BANDAGES/DRESSINGS) ×1
BNDG CMPR MED 15X6 ELC VLCR LF (GAUZE/BANDAGES/DRESSINGS) ×1
BNDG ELASTIC 6X15 VLCR STRL LF (GAUZE/BANDAGES/DRESSINGS) ×2 IMPLANT
BNDG ESMARK 6X9 LF (GAUZE/BANDAGES/DRESSINGS) ×2
BOWL SMART MIX CTS (DISPOSABLE) ×2 IMPLANT
CAPT KNEE TOTAL 3 ATTUNE ×1 IMPLANT
CEMENT HV SMART SET (Cement) ×3 IMPLANT
COVER SURGICAL LIGHT HANDLE (MISCELLANEOUS) ×2 IMPLANT
CUFF TOURNIQUET SINGLE 34IN LL (TOURNIQUET CUFF) IMPLANT
DRAPE EXTREMITY T 121X128X90 (DRAPE) ×2 IMPLANT
DRAPE HALF SHEET 40X57 (DRAPES) ×2 IMPLANT
DRAPE U-SHAPE 47X51 STRL (DRAPES) ×2 IMPLANT
DRSG AQUACEL AG ADV 3.5X10 (GAUZE/BANDAGES/DRESSINGS) ×2 IMPLANT
DRSG AQUACEL AG ADV 3.5X14 (GAUZE/BANDAGES/DRESSINGS) ×1 IMPLANT
DURAPREP 26ML APPLICATOR (WOUND CARE) ×2 IMPLANT
ELECT CAUTERY BLADE 6.4 (BLADE) ×2 IMPLANT
ELECT REM PT RETURN 9FT ADLT (ELECTROSURGICAL) ×2
ELECTRODE REM PT RTRN 9FT ADLT (ELECTROSURGICAL) ×1 IMPLANT
FACESHIELD WRAPAROUND (MASK) ×2 IMPLANT
FACESHIELD WRAPAROUND OR TEAM (MASK) ×1 IMPLANT
FILTER STRAW FLUID ASPIR (MISCELLANEOUS) ×1 IMPLANT
GAUZE SPONGE 4X4 12PLY STRL (GAUZE/BANDAGES/DRESSINGS) ×2 IMPLANT
GLOVE BIO SURGEON STRL SZ7 (GLOVE) ×3 IMPLANT
GLOVE BIOGEL PI IND STRL 7.0 (GLOVE) ×1 IMPLANT
GLOVE BIOGEL PI IND STRL 7.5 (GLOVE) ×1 IMPLANT
GLOVE BIOGEL PI IND STRL 8 (GLOVE) IMPLANT
GLOVE BIOGEL PI INDICATOR 7.0 (GLOVE) ×4
GLOVE BIOGEL PI INDICATOR 7.5 (GLOVE) ×1
GLOVE BIOGEL PI INDICATOR 8 (GLOVE) ×1
GLOVE SS BIOGEL STRL SZ 7.5 (GLOVE) ×1 IMPLANT
GLOVE SUPERSENSE BIOGEL SZ 7.5 (GLOVE) ×1
GOWN STRL REUS W/ TWL LRG LVL3 (GOWN DISPOSABLE) ×1 IMPLANT
GOWN STRL REUS W/ TWL XL LVL3 (GOWN DISPOSABLE) ×2 IMPLANT
GOWN STRL REUS W/TWL LRG LVL3 (GOWN DISPOSABLE) ×2
GOWN STRL REUS W/TWL XL LVL3 (GOWN DISPOSABLE) ×4
HANDPIECE INTERPULSE COAX TIP (DISPOSABLE) ×2
HOOD PEEL AWAY FACE SHEILD DIS (HOOD) ×4 IMPLANT
IMMOBILIZER KNEE 22 UNIV (SOFTGOODS) IMPLANT
KIT BASIN OR (CUSTOM PROCEDURE TRAY) ×2 IMPLANT
KIT ROOM TURNOVER OR (KITS) ×2 IMPLANT
MANIFOLD NEPTUNE II (INSTRUMENTS) ×2 IMPLANT
NDL 18GX1X1/2 (RX/OR ONLY) (NEEDLE) ×1 IMPLANT
NDL SPNL 18GX3.5 QUINCKE PK (NEEDLE) IMPLANT
NEEDLE 18GX1X1/2 (RX/OR ONLY) (NEEDLE) ×2 IMPLANT
NEEDLE SPNL 18GX3.5 QUINCKE PK (NEEDLE) IMPLANT
NS IRRIG 1000ML POUR BTL (IV SOLUTION) ×2 IMPLANT
PACK TOTAL JOINT (CUSTOM PROCEDURE TRAY) ×2 IMPLANT
PAD ARMBOARD 7.5X6 YLW CONV (MISCELLANEOUS) ×4 IMPLANT
SET HNDPC FAN SPRY TIP SCT (DISPOSABLE) ×1 IMPLANT
STRIP CLOSURE SKIN 1/2X4 (GAUZE/BANDAGES/DRESSINGS) ×3 IMPLANT
SUCTION FRAZIER HANDLE 10FR (MISCELLANEOUS) ×1
SUCTION TUBE FRAZIER 10FR DISP (MISCELLANEOUS) ×1 IMPLANT
SUT ETHIBOND NAB CT1 #1 30IN (SUTURE) IMPLANT
SUT ETHILON 3 0 PS 1 (SUTURE) ×1 IMPLANT
SUT MNCRL AB 3-0 PS2 18 (SUTURE) ×2 IMPLANT
SUT VIC AB 0 CT1 27 (SUTURE) ×2
SUT VIC AB 0 CT1 27XBRD ANBCTR (SUTURE) ×1 IMPLANT
SUT VIC AB 1 CT1 27 (SUTURE) ×2
SUT VIC AB 1 CT1 27XBRD ANBCTR (SUTURE) ×1 IMPLANT
SUT VIC AB 2-0 CT1 27 (SUTURE) ×6
SUT VIC AB 2-0 CT1 TAPERPNT 27 (SUTURE) ×2 IMPLANT
SYR 30ML LL (SYRINGE) ×2 IMPLANT
TOWEL OR 17X24 6PK STRL BLUE (TOWEL DISPOSABLE) ×2 IMPLANT
TOWEL OR 17X26 10 PK STRL BLUE (TOWEL DISPOSABLE) ×2 IMPLANT

## 2016-04-20 NOTE — Transfer of Care (Signed)
Immediate Anesthesia Transfer of Care Note  Patient: Adam Watson  Procedure(s) Performed: Procedure(s): KNEE ARTHROPLASY WITH  MENISCECTOMY (Right) TOTAL KNEE (Right)  Patient Location: PACU  Anesthesia Type:MAC, Regional and Spinal  Level of Consciousness: awake, alert , oriented and patient cooperative  Airway & Oxygen Therapy: Patient Spontanous Breathing and Patient connected to nasal cannula oxygen  Post-op Assessment: Report given to RN, Post -op Vital signs reviewed and stable and Patient moving all extremities  Post vital signs: Reviewed and stable  Last Vitals:  Vitals:   04/20/16 0908  BP: 136/87  Pulse: 100  Resp: 18  Temp: 37.3 C    Last Pain:  Vitals:   04/20/16 0932  TempSrc:   PainSc: 0-No pain      Patients Stated Pain Goal: 3 (41/28/78 6767)  Complications: No apparent anesthesia complications

## 2016-04-20 NOTE — Anesthesia Preprocedure Evaluation (Addendum)
Anesthesia Evaluation  Patient identified by MRN, date of birth, ID band Patient awake    Reviewed: Allergy & Precautions, NPO status , Patient's Chart, lab work & pertinent test results  History of Anesthesia Complications (+) PONV  Airway Mallampati: II  TM Distance: >3 FB Neck ROM: Full    Dental   Pulmonary COPD,    breath sounds clear to auscultation       Cardiovascular hypertension, Pt. on medications + dysrhythmias (Hx WPW s/p ablation) + Valvular Problems/Murmurs (Bicuspid AV)  Rhythm:Regular Rate:Normal  Echo 04/15/16:  - Left ventricle: The cavity size was normal. Wall thickness wasnormal. Systolic function was normal. The estimated ejectionfraction was in the range of 55% to 60%. Features are consistentwith a pseudonormal left ventricular filling pattern, withconcomitant abnormal relaxation and increased filling pressure(grade 2 diastolic dysfunction). - Aortic valve: Bicuspid; mildly calcified leaflets. There was nostenosis. There was trivial regurgitation. Mean gradient (S): 8mm Hg. - Aorta: Mildly dilated aortic root. Aortic root dimension: 40 mm(ED). - Mitral valve: Mildly calcified annulus. There was no significantregurgitation. - Right ventricle: The cavity size was normal. Systolic functionwas normal. - Tricuspid valve: Peak RV-RA gradient (S): 30 mm Hg. - Pulmonary arteries: PA peak pressure: 33 mm Hg (S). - Inferior vena cava: The vessel was normal in size. The respirophasic diameter changes were in the normal range (= 50%), consistent with normal central venous pressure.   Neuro/Psych negative neurological ROS     GI/Hepatic Neg liver ROS, GERD  ,  Endo/Other  negative endocrine ROS  Renal/GU negative Renal ROS     Musculoskeletal  (+) Arthritis ,   Abdominal   Peds  Hematology negative hematology ROS (+)   Anesthesia Other Findings   Reproductive/Obstetrics                             Lab Results  Component Value Date   WBC 6.9 04/07/2016   HGB 16.2 04/07/2016   HCT 45.9 04/07/2016   MCV 92.2 04/07/2016   PLT 284 04/07/2016   Lab Results  Component Value Date   CREATININE 1.08 04/07/2016   BUN 10 04/07/2016   NA 139 04/07/2016   K 3.9 04/07/2016   CL 105 04/07/2016   CO2 26 04/07/2016   Lab Results  Component Value Date   INR 0.98 02/27/2009    Anesthesia Physical Anesthesia Plan  ASA: III  Anesthesia Plan: Spinal   Post-op Pain Management:  Regional for Post-op pain   Induction: Intravenous  Airway Management Planned: Natural Airway and Simple Face Mask  Additional Equipment:   Intra-op Plan:   Post-operative Plan:   Informed Consent: I have reviewed the patients History and Physical, chart, labs and discussed the procedure including the risks, benefits and alternatives for the proposed anesthesia with the patient or authorized representative who has indicated his/her understanding and acceptance.     Plan Discussed with: CRNA and Surgeon  Anesthesia Plan Comments:        Anesthesia Quick Evaluation

## 2016-04-20 NOTE — Anesthesia Procedure Notes (Signed)
Anesthesia Regional Block:  Adductor canal block  Pre-Anesthetic Checklist: ,, timeout performed, Correct Patient, Correct Site, Correct Laterality, Correct Procedure, Correct Position, site marked, Risks and benefits discussed,  Surgical consent,  Pre-op evaluation,  At surgeon's request and post-op pain management  Laterality: Right  Prep: chloraprep       Needles:  Injection technique: Single-shot  Needle Type: Echogenic Needle     Needle Length: 9cm 9 cm Needle Gauge: 21 and 21 G    Additional Needles:  Procedures: ultrasound guided (picture in chart) Adductor canal block Narrative:  Start time: 04/20/2016 9:57 AM End time: 04/20/2016 10:04 AM Injection made incrementally with aspirations every 5 mL.  Performed by: Personally  Anesthesiologist: Marcene DuosFITZGERALD, [employer] Southers

## 2016-04-20 NOTE — Progress Notes (Signed)
Orthopedic Tech Progress Note Patient Details:  Arnoldo MoraleJohn R Kostick Apr 12, 1960 [id number]  CPM Right Knee CPM Right Knee: On Right Knee Flexion (Degrees): 90 Right Knee Extension (Degrees): 0 Additional Comments: foot roll   Saul FordyceJennifer C Khyren Hing 04/20/2016, 2:52 PM

## 2016-04-20 NOTE — Interval H&P Note (Signed)
.  H&P done

## 2016-04-20 NOTE — Anesthesia Procedure Notes (Signed)
Spinal  Patient location during procedure: OR Start time: 04/20/2016 11:48 AM Staffing Anesthesiologist: Mal AmabileFOSTER, Tae Robak Performed: anesthesiologist  Preanesthetic Checklist Completed: patient identified, site marked, surgical consent, pre-op evaluation, timeout performed, IV checked, risks and benefits discussed and monitors and equipment checked Spinal Block Patient position: sitting Prep: site prepped and draped and DuraPrep Patient monitoring: heart rate, cardiac monitor, continuous pulse ox and blood pressure Approach: midline Location: L4-5 Injection technique: single-shot Needle Needle type: Pencan  Needle gauge: 24 G Needle length: 9 cm Needle insertion depth: 6 cm Assessment Sensory level: T6 Additional Notes Patient tolerated procedure well. Adequate sensory level.

## 2016-04-21 ENCOUNTER — Encounter (HOSPITAL_COMMUNITY): Payer: Self-pay | Admitting: Orthopedic Surgery

## 2016-04-21 ENCOUNTER — Other Ambulatory Visit: Payer: Self-pay

## 2016-04-21 ENCOUNTER — Inpatient Hospital Stay (HOSPITAL_COMMUNITY): Payer: Worker's Compensation

## 2016-04-21 DIAGNOSIS — R000 Tachycardia, unspecified: Secondary | ICD-10-CM

## 2016-04-21 DIAGNOSIS — M1731 Unilateral post-traumatic osteoarthritis, right knee: Principal | ICD-10-CM

## 2016-04-21 LAB — TROPONIN I
Troponin I: <0.03 ng/mL (ref <0.03)
Troponin I: <0.03 ng/mL (ref <0.03)
Troponin I: <0.03 ng/mL (ref <0.03)

## 2016-04-21 LAB — BASIC METABOLIC PANEL
Anion gap: 8 (ref 5–15)
BUN: 13 mg/dL (ref 6–20)
CO2: 22 mmol/L (ref 22–32)
Calcium: 9.2 mg/dL (ref 8.9–10.3)
Chloride: 106 mmol/L (ref 101–111)
Creatinine, Ser: 1.17 mg/dL (ref 0.61–1.24)
GFR calc Af Amer: >60 mL/min (ref >60)
GFR calc non Af Amer: >60 mL/min (ref >60)
Glucose, Bld: 237 mg/dL — ABNORMAL HIGH (ref 65–99)
Potassium: 3.9 mmol/L (ref 3.5–5.1)
Sodium: 136 mmol/L (ref 135–145)

## 2016-04-21 LAB — CBC
HCT: 41.1 % (ref 39.0–52.0)
Hemoglobin: 14.5 g/dL (ref 13.0–17.0)
MCH: 31.9 pg (ref 26.0–34.0)
MCHC: 35.3 g/dL (ref 30.0–36.0)
MCV: 90.3 fL (ref 78.0–100.0)
Platelets: 233 10*3/uL (ref 150–400)
RBC: 4.55 MIL/uL (ref 4.22–5.81)
RDW: 13 % (ref 11.5–15.5)
WBC: 13.4 10*3/uL — ABNORMAL HIGH (ref 4.0–10.5)

## 2016-04-21 LAB — D-DIMER, QUANTITATIVE: D-Dimer, Quant: 12.4 ug/mL-FEU — ABNORMAL HIGH (ref 0.00–0.50)

## 2016-04-21 MED ORDER — IOPAMIDOL (ISOVUE-370) INJECTION 76%
INTRAVENOUS | Status: AC
  Administered 2016-04-21: 100 mL via INTRAVENOUS
  Filled 2016-04-21: qty 100

## 2016-04-21 MED ORDER — METOPROLOL SUCCINATE ER 50 MG PO TB24
50.0000 mg | ORAL_TABLET | Freq: Every day | ORAL | Status: DC
  Administered 2016-04-21 – 2016-04-22 (×2): 50 mg via ORAL
  Filled 2016-04-21 (×2): qty 1

## 2016-04-21 NOTE — Progress Notes (Signed)
Called Wainer answering service to notify PA on-call of patient's d-dimer results of 12.40 at [phone]

## 2016-04-21 NOTE — Care Management (Addendum)
Spoke to [employer] case Production designer, theatre/television/filmmanager . Patient's DME has been approved , DME agency will call patient directly to schedule delivery .   [facility] will call patient directly to let him know which home health agency will be providing HHPT . HHPT will start this Thursday Apr 23, 2016 .  Patient and family aware of above and voiced understanding. Confirmed patient's cell is [phone], his wife is Victorino DikeJennifer and her cell is [phone] . [place] with workers comp has both cell numbers and will make sure [facility] [employer] and their DME agency also has them .   Patient is being represented by a lawyer, therefore Workers Comp case Production designer, theatre/television/filmmanager is not allowed to speak to patient directly.   Ronny FlurryHeather Laveah Gloster RN BSN [phone]

## 2016-04-21 NOTE — Progress Notes (Signed)
Patient's HR was in the 120s consistently. Called answering service for Dr. Thurston HoleWainer at [phone] and asked to page physician on-call to notify for HR staying in the 120s. Patient's HR then increased to 140s-150s. Patient was not experiencing any chest pain or shortness of breath. Notified rapid response. Obtained EKG and placed patient on cardiac monitor. Called answering service for Dr. Thurston HoleWainer again at [facility]. Dub MikesStanbery PA returned call at [phone]; notified Dub MikesStanbery of patient's condition. Dub MikesStanbery stated she would notify Dr. Eulah PontMurphy and return my call. Dub MikesStanbery returned call and stated she would notify cardiology. Labs H&H and d-dimer ordered STAT. Dub MikesStanbery stated she would consult hospitalist.

## 2016-04-21 NOTE — Progress Notes (Signed)
Subjective: 1 Day Post-Op Procedure(s) (LRB): KNEE ARTHROPLASY WITH  MENISCECTOMY (Right) TOTAL KNEE (Right) Patient reports pain as mild.  Patient still remains asymptomatic without lightheadedness/dizziness, chest pain/palpitations/sob.  No nausea/vomiting.   Objective: Vital signs in last 24 hours: Temp:  [97.3 F (36.3 C)-99.1 F (37.3 C)] 98.3 F (36.8 C) (01/30 0517) Pulse Rate:  [88-145] 114 (01/30 0517) Resp:  [8-18] 18 (01/30 0517) BP: (107-144)/(75-97) 133/84 (01/30 0517) SpO2:  [88 %-97 %] 95 % (01/30 0517) Weight:  [117.9 kg (260 lb)] 117.9 kg (260 lb) (01/29 1831)  Intake/Output from previous day: 01/29 0701 - 01/30 0700 In: 2518.7 [P.O.:342; I.V.:2176.7] Out: 2275 [Urine:2175; Blood:100] Intake/Output this shift: No intake/output data recorded.   Recent Labs  04/21/16 0125  HGB 14.5    Recent Labs  04/21/16 0125  WBC 13.4*  RBC 4.55  HCT 41.1  PLT 233    Recent Labs  04/21/16 0125  NA 136  K 3.9  CL 106  CO2 22  BUN 13  CREATININE 1.17  GLUCOSE 237*  CALCIUM 9.2   No results for input(s): LABPT, INR in the last 72 hours.  Neurologically intact Neurovascular intact Sensation intact distally Intact pulses distally Dorsiflexion/Plantar flexion intact Incision: dressing C/D/I No cellulitis present Compartment soft  Assessment/Plan: 1 Day Post-Op Procedure(s) (LRB): KNEE ARTHROPLASY WITH  MENISCECTOMY (Right) TOTAL KNEE (Right) Advance diet Up with therapy Discharge home with home health likely tomorrow CT angio negative and PE r/o.   Serial troponins pending, however first trop is within normal limits and likely no MI present Will have cardiology see him today as he has a hx of WPW and mitral valve stenosis Continue telemetry Encouraged use of incentive spirometer ABLA-mild and stable Please remove ace bandage and apply ted hose prior to d/c tomorrow.  Otilio SaberM Lindsey Aleatha Taite 04/21/2016, 7:31 AM

## 2016-04-21 NOTE — Consult Note (Signed)
Cardiology Consult    Patient ID: Adam Watson MRN: [id number], DOB/AGE: [id number]   Admit date: 04/20/2016 Date of Consult: 04/21/2016  Primary Physician: Adam Bay, MD Reason for Consult: Shortness of breath and tachycardia Primary Cardiologist: Dr. Antoine Poche Requesting Provider: Dr. Thurston Hole  History of Present Illness    Adam Watson is a 56 y.o. with past medical history significant for HTN (no meds), bicuspid aortic valve, WPW with history of ablation at [facility] in the 1990's, COPD and GERD who presented to [facility] for planned total knee arthroplasty. The patient had his surgery yesterday and was noted to have heart rate sustaining in the 150's late in the evening. We have been asked to consult for evaluation of tachycardia.  D-Dimer was checked:  12.40. CTA of the chest showed no pulmonary embolus, scattered atelectasis and stable pulmonary nodules  Troponins have been negative X 2.  04/21/16 0020 EKG Sinus tachycardia at 140 bpm no acute ischemic changes.  04/07/16 EKG NSR 88 bpm    04/13/16 Sinus tachycardia 106 bpm   Pt was evaluated pre-operatively in our office By Dr Antoine Poche on 04/13/2016. At this visit his heart rate was 106 bpm. An echocardiogram was done during this cardiac evaluation with finding of LV EF 55-60%, grade 2 DD, bicuspid aortic valve with no significant stenosis, trivial AI and mildly dilated aortic root. He had no symptoms and a high functional level.   Past Medical History   Past Medical History:  Diagnosis Date  . Bicuspid aortic valve   . COPD (chronic obstructive pulmonary disease) (HCC)   . Diarrhea    hx  . GERD (gastroesophageal reflux disease)   . History of kidney stones   . History of stomach ulcers   . Hypertension    no meds  . PONV (postoperative nausea and vomiting)   . Post-traumatic osteoarthritis of right knee 04/07/2016  . Pulmonary nodules   . Wart    "inside my nose" (04/20/2016)  . WPW (Wolff-Parkinson-White  syndrome)     Past Surgical History:  Procedure Laterality Date  . ANTERIOR CERVICAL DECOMP/DISCECTOMY FUSION  ~ 2011  . APPENDECTOMY    . BACK SURGERY    . CARDIAC ELECTROPHYSIOLOGY MAPPING AND ABLATION  1990s   "for WPW"  . CERVICAL SPINE SURGERY     "trimmed discs"  . CYSTOSCOPY WITH URETEROSCOPY, STONE BASKETRY AND STENT PLACEMENT    . JOINT REPLACEMENT    . KNEE ARTHROSCOPY Right 09/2014; 08/2015  . LAPAROSCOPIC CHOLECYSTECTOMY    . PARTIAL KNEE ARTHROPLASTY Right 04/20/2016   Procedure: KNEE ARTHROPLASY WITH  MENISCECTOMY;  Surgeon: Salvatore Marvel, MD;  Location: [facility] OR;  Service: Orthopedics;  Laterality: Right;  . TOTAL KNEE ARTHROPLASTY Right 04/20/2016  . TOTAL KNEE REVISION Right 04/20/2016   Procedure: TOTAL KNEE;  Surgeon: Salvatore Marvel, MD;  Location: [facility] OR;  Service: Orthopedics;  Laterality: Right;     Allergies  Allergies  Allergen Reactions  . Codeine Other (See Comments)    Eyes burning  . Penicillins Hives and Rash    Inpatient Medications    . apixaban  2.5 mg Oral Q12H  . celecoxib  200 mg Oral Q12H  . dexamethasone  10 mg Intravenous Q8H  . docusate sodium  100 mg Oral BID  . mupirocin ointment  1 application Nasal BID  . pantoprazole  40 mg Oral Daily  . polyethylene glycol  17 g Oral BID    Family History    Family History  Problem Relation Age of Onset  . Hypertension Mother   . Lupus Mother   . Kidney failure Mother   . Cancer Father     throat and lungs  . Hypertension Brother   . Heart attack Brother   . Diverticulitis Brother     Social History    Social History   Social History  . Marital status: Married    Spouse name: N/A  . Number of children: 2  . Years of education: N/A   Occupational History  . Trucker    Social History Main Topics  . Smoking status: Never Smoker  . Smokeless tobacco: Never Used  . Alcohol use No  . Drug use: No  . Sexual activity: Not Currently   Other Topics Concern  . Not on file   Social  History Narrative   Eight grandchildren.       Review of Systems    General:  No chills, fever, night sweats or weight changes.  Cardiovascular:  No chest pain, dyspnea on exertion, edema, orthopnea, palpitations, paroxysmal nocturnal dyspnea. Dermatological: No rash, lesions/masses Respiratory: No cough, dyspnea Urologic: No hematuria, dysuria Abdominal:   No nausea, vomiting, diarrhea, bright red blood per rectum, melena, or hematemesis Neurologic:  No visual changes, wkns, changes in mental status. All other systems reviewed and are otherwise negative except as noted above.  Physical Exam    Blood pressure (!) 149/94, pulse 95, temperature 98.2 F (36.8 C), temperature source Oral, resp. rate 18, height 6' (1.829 m), weight 260 lb (117.9 kg), SpO2 96 %.  General: Pleasant, NAD Psych: Normal affect. Neuro: Alert and oriented X 3. Moves all extremities spontaneously. HEENT: Normal  Neck: Supple without bruits or JVD. Lungs:  Resp regular and unlabored, CTA. Heart: RRR no s3, s4, or murmurs. Abdomen: Soft, non-tender, non-distended, BS + x 4.  Extremities: No clubbing, cyanosis or edema. DP/PT/Radials 2+ and equal bilaterally. Right leg in CPM machine  Labs    Troponin (Point of Care Test) No results for input(s): TROPIPOC in the last 72 hours.  Recent Labs  04/21/16 0455 04/21/16 1034  TROPONINI <0.03 <0.03   Lab Results  Component Value Date   WBC 13.4 (H) 04/21/2016   HGB 14.5 04/21/2016   HCT 41.1 04/21/2016   MCV 90.3 04/21/2016   PLT 233 04/21/2016    Recent Labs Lab 04/21/16 0125  NA 136  K 3.9  CL 106  CO2 22  BUN 13  CREATININE 1.17  CALCIUM 9.2  GLUCOSE 237*   No results found for: CHOL, HDL, LDLCALC, TRIG Lab Results  Component Value Date   DDIMER 12.40 (H) 04/21/2016     Radiology Studies    Ct Angio Chest Pe W Or Wo Contrast  Result Date: 04/21/2016 CLINICAL DATA:  Tachycardia.  Post knee surgery. EXAM: CT ANGIOGRAPHY CHEST WITH  CONTRAST TECHNIQUE: Multidetector CT imaging of the chest was performed using the standard protocol during bolus administration of intravenous contrast. Multiplanar CT image reconstructions and MIPs were obtained to evaluate the vascular anatomy. CONTRAST:  100 cc Isovue 370 IV COMPARISON:  Chest CT 08/20/2015, and 05/14/2014 FINDINGS: Cardiovascular: There are no filling defects within the pulmonary arteries to the distal segmental level to suggest pulmonary embolus. No evidence of aortic dissection. The heart is normal in size. Mediastinum/Nodes: Small mediastinal nodes, not enlarged by size criteria. No hilar adenopathy. No axillary adenopathy. Lungs/Pleura: Scattered linear atelectasis throughout both lungs. Mild dependent atelectasis in the right lower lobe. Right lower lobe pulmonary  nodule measures 10 x 5 mm, unchanged to slightly decreased in size from prior CTs. Tiny subpleural nodule in the right upper lobe image 62 series 5 is unchanged. A more central small pulmonary nodule on prior CT in the right upper lobe is not currently visualized. No evidence of pulmonary edema. No pleural fluid. Trachea and mainstem bronchi are patent. Upper Abdomen: No acute abnormality. Musculoskeletal: There are no acute or suspicious osseous abnormalities. Review of the MIP images confirms the above findings. IMPRESSION: 1. No pulmonary embolus. 2. Scattered atelectasis. 3. Stable right lower lobe pulmonary nodules over the course of 2 years. Electronically Signed   By: Rubye OaksMelanie  Ehinger M.D.   On: 04/21/2016 06:01    EKG & Cardiac Imaging    EKG: Sinus tachycardia at 140 bpm with new small q waves in V2-V3  Echocardiogram:  04/15/2016 Study Conclusions - Left ventricle: The cavity size was normal. Wall thickness was   normal. Systolic function was normal. The estimated ejection   fraction was in the range of 55% to 60%. Features are consistent   with a pseudonormal left ventricular filling pattern, with    concomitant abnormal relaxation and increased filling pressure   (grade 2 diastolic dysfunction). - Aortic valve: Bicuspid; mildly calcified leaflets. There was no   stenosis. There was trivial regurgitation. Mean gradient (S): 8   mm Hg. - Aorta: Mildly dilated aortic root. Aortic root dimension: 40 mm   (ED). - Mitral valve: Mildly calcified annulus. There was no significant   regurgitation. - Right ventricle: The cavity size was normal. Systolic function   was normal. - Tricuspid valve: Peak RV-RA gradient (S): 30 mm Hg. - Pulmonary arteries: PA peak pressure: 33 mm Hg (S). - Inferior vena cava: The vessel was normal in size. The   respirophasic diameter changes were in the normal range (= 50%),   consistent with normal central venous pressure.  Impressions: - Normal LV size with EF 55-60%. Moderate diastolic dysfunction.   Normal RV size and systolic function. Bicuspid aortic valve with   no significant stenosis, trivial AI. Mildly dilated aortic root.   Assessment & Plan    Tachycardia -Pt is POD 1 S/P total knee arthoplasty and meniscectomy -HR sustaining 150's late last night. Has gradually come down to around 100. At pre-op evaluation on 04/13/16, EKG heart rate was 106. His heart rate goes up to 120-130 with ambulation, but he remains asymptomatic -Pt has remained asymptomatic with no chest pain, shortness of breath, lightheadedness. His wife noted the elevated heart rate on bedside monitor. Pt has not been in pain -EKG Sinus tachycardia at 140 bpm, will repeat EKG in am -Echo on 04/15/16 showed normal LVEF, bicuspid aortic valve with no significant stenosis -D-Dimer was elevated, but CTA chest showed no PE -Will add low dose betablocker in place of losartan for BP control and follow up as outpatient.   I will arrange follow up with Dr Antoine PocheHochrein in 1 month   Signed, Berton BonJanine Hammond, NP-C 04/21/2016, 3:34 PM Pager: [phone]   History and all data above reviewed.   Patient examined.  I agree with the findings as above.  The patient has had resting tachycardia and tachycardia during ambulation.  He is somewhat anxious.  However, he feels well.  The patient denies any new symptoms such as chest discomfort, neck or arm discomfort. There has been no new shortness of breath, PND or orthopnea. There have been no reported palpitations, presyncope or syncope.   The patient exam  reveals COR:RRR  ,  Lungs: Clear  ,  Abd: Positive bowel sounds, no rebound no guarding, Ext No edema, right leg bandaged  .  All available labs, radiology testing, previous records reviewed. Agree with documented assessment and plan. Tachycardia:  No evidence of an acute cardiac event.  He has a baseline sinus tach.  However, he otherwise has significant cardiovascular symptoms.  He does have bicuspid aortic valve but no stenosis.  At this point I will repeat an EKG in the AM.  I will follow up in the weeks to come with a 24 hour Holter as an outpatient.  I will switch to   Rollene Rotunda  4:34 PM  04/21/2016

## 2016-04-21 NOTE — Anesthesia Postprocedure Evaluation (Signed)
Anesthesia Post Note  Patient: Adam Watson  Procedure(s) Performed: Procedure(s) (LRB): KNEE ARTHROPLASY WITH  MENISCECTOMY (Right) TOTAL KNEE (Right)  Patient location during evaluation: PACU Anesthesia Type: Spinal and MAC Level of consciousness: awake and alert Pain management: pain level controlled Vital Signs Assessment: post-procedure vital signs reviewed and stable Respiratory status: spontaneous breathing and respiratory function stable Cardiovascular status: blood pressure returned to baseline and stable Postop Assessment: spinal receding Anesthetic complications: no       Last Vitals:  Vitals:   04/21/16 0210 04/21/16 0517  BP: 125/83 133/84  Pulse: (!) 117 (!) 114  Resp: 17 18  Temp: 37.3 C 36.8 C    Last Pain:  Vitals:   04/21/16 0911  TempSrc:   PainSc: 3                  Tiajuana Amass

## 2016-04-21 NOTE — Progress Notes (Signed)
Notified Dr. Shirlee LatchMcLean cardiology of patient's d-dimer result 12.40 at 0409. Notified orthopedic PA Dub MikesStanbery of patient's d-dimer result, who consulted hospitalist; CT angiogram of chest ordered. Dub MikesStanbery stated to contact hospitalist if CT positive and order heparin per pharmacy.

## 2016-04-21 NOTE — Progress Notes (Signed)
Inpatient Diabetes Program Recommendations  AACE/ADA: New Consensus Statement on Inpatient Glycemic Control (2015)  Target Ranges:  Prepandial:   less than 140 mg/dL      Peak postprandial:   less than 180 mg/dL (1-2 hours)      Critically ill patients:  140 - 180 mg/dL   No results found for: GLUCAP, HGBA1C  Review of Glycemic Control  Diabetes history: Obesity Outpatient Diabetes medications: none Current orders for Inpatient glycemic control: none  Inpatient Diabetes Program Recommendations:     Please consider monitoring CBGs TIDAC and QHS.     Per ADA recommendations "consider performing an A1C on all patients with diabetes or hyperglycemia admitted to the hospital if not performed in the prior 3 months".  Thank you,  Kristine LineaKaren Karessa Onorato, RN, MSN Diabetes Coordinator Inpatient Diabetes Program [phone] (Team Pager)

## 2016-04-21 NOTE — Progress Notes (Signed)
Physical Therapy Treatment Patient Details Name: Adam Watson MRN: [id number] DOB: 1961/01/06 Today's Date: 04/21/2016    History of Present Illness 56 y.o. male admitted to [facility] on 04/20/16 for elective R TKA with menisectomy.  Pt with post op tachycardia.  Pt with significant PMHx of HTN, COPD, bicuspid aortic valve, multilple right knee surgeries, cervical spine surgery, and back surgery.    PT Comments    Pt is POD #1 and this is his second session.  He was able to progress gait further down the hallway and with less of a HR increased (max 135) than in prior sessions.  Pt needs to practice stairs prior to d/c.  He remains asymptomatic with HR increases.  PT will continue to follow acutely.    Follow Up Recommendations  Home health PT;Supervision - Intermittent     Equipment Recommendations  Rolling walker with 5" wheels;3in1 (PT)    Recommendations for Other Services   NA     Precautions / Restrictions Precautions Precautions: Knee Precaution Booklet Issued: Yes (comment) Precaution Comments: knee exercise handout given in previous session, no pillow under operative knee reviewed Required Braces or Orthoses: Knee Immobilizer - Right Knee Immobilizer - Right: On when out of bed or walking;Discontinue once straight leg raise with < 10 degree lag (pt was able to do 10 stable SLR today, d/c next session) Restrictions RLE Weight Bearing: Weight bearing as tolerated    Mobility  Bed Mobility Overal bed mobility: Needs Assistance Bed Mobility: Supine to Sit;Sit to Supine     Supine to sit: Modified independent (Device/Increase time) Sit to supine: Modified independent (Device/Increase time)   General bed mobility comments: Pt able to move into and out of bed on his own.   Transfers Overall transfer level: Needs assistance Equipment used: Rolling walker (2 wheeled) Transfers: Sit to/from Stand Sit to Stand: Min guard         General transfer comment: Min guard assist for  safety during transitions.   Ambulation/Gait Ambulation/Gait assistance: Min guard Ambulation Distance (Feet): 150 Feet Assistive device: Rolling walker (2 wheeled) Gait Pattern/deviations: Step-to pattern;Antalgic Gait velocity: decreased Gait velocity interpretation: Below normal speed for age/gender General Gait Details: Pt with moderately antalgic gait pattern, verbal cues for safe RW use.  Took brace off after TE in room and pt was able to walk a short distance with better/more normal gait pattern and no signs of buckling, so recommend brace removal for all further therapy sessions.  Pt's HR ranged from 110s at rest to 135 during gait.  O2 sats 94% or higher on RA during gait.            Balance Overall balance assessment: Needs assistance Sitting-balance support: Feet supported;No upper extremity supported Sitting balance-Leahy Scale: Good     Standing balance support: Bilateral upper extremity supported;No upper extremity supported;Single extremity supported Standing balance-Leahy Scale: Fair                      Cognition Arousal/Alertness: Awake/alert Behavior During Therapy: WFL for tasks assessed/performed Overall Cognitive Status: Within Functional Limits for tasks assessed                      Exercises Total Joint Exercises Ankle Circles/Pumps: AROM;Both;20 reps Quad Sets: AROM;Right;10 reps Towel Squeeze: AROM;Both;10 reps Short Arc Quad: AROM;Right;10 reps Heel Slides: AAROM;Right;10 reps Hip ABduction/ADduction: AROM;Right;10 reps        Pertinent Vitals/Pain Pain Assessment: 0-10 Pain Score: 3  Pain  Location: right knee Pain Descriptors / Indicators: Aching;Grimacing;Guarding;Operative site guarding Pain Intervention(s): Limited activity within patient's tolerance;Monitored during session;Repositioned           PT Goals (current goals can now be found in the care plan section) Acute Rehab PT Goals Patient Stated Goal: to get  better Progress towards PT goals: Progressing toward goals    Frequency    7X/week      PT Plan Current plan remains appropriate       End of Session Equipment Utilized During Treatment: Gait belt Activity Tolerance: Patient limited by pain;Patient limited by fatigue Patient left: in bed;in CPM;with call bell/phone within reach;with family/visitor present     Time: [id number] PT Time Calculation (min) (ACUTE ONLY): 43 min  Charges:  $Gait Training: 8-22 mins $Therapeutic Exercise: 8-22 mins $Therapeutic Activity: 8-22 mins                     Wilhemina Grall B. Sheyna Pettibone, PT, DPT [phone]   04/21/2016, 5:23 PM

## 2016-04-21 NOTE — Significant Event (Signed)
Rapid Response Event Note  Overview: Time Called: 2352 Arrival Time: 0030 Event Type: Cardiac  Initial Focused Assessment:  Called by Valentino Saxonherry, RN with concerns for this pt s/p right total knee replacement having HR sustained at 150s.  Pt is chest pain free, feels no palpitations. Denies SOB BP 150/85 Bilateral BS clear.  Interventions:  Pt placed on telemetry.  12 lead EKG obtained  . ? Changes in leads V2, V3, V4 from prior EKG Jacelyn PiLIndsay Stanbery,Ortho  PA notified of concerns and she will consult cardiolgy.  Plan of Care (if not transferred): Continue to monitor Pt's HR, development of CP and SOB  Event Summary:   at      at          [employer], Sheffield SliderPaula K

## 2016-04-21 NOTE — Care Management Note (Signed)
Case Management Note  Patient Details  Name: Arnoldo MoraleJohn R Swalley MRN: [id number] Date of Birth: 1960-11-20  Subjective/Objective:                    Action/Plan: Spoke to patient at bedside .  Workers Occupational hyg[employer] ( [employer] )  information:   Workers Comp Sports coachcase manager is [employer] phone [phone] fax [phone]  Adjustor is Birdie Sonsadia Shutkusski [phone]   Patient's claim number is [id number]  Spoke with Osborne Cascoadia , left message for [place].   Spoke with Kieth Brightlyacey at Dr Sherene SiresWainer's office , they have already faxed orders for HHPT, CPM, cold therapy , commode and walker to [employer].   Awaiting call back from [employer] with name of agencies providing DME and home health . Patient aware and voiced understanding.  Expected Discharge Date:  04/22/16               Expected Discharge Plan:  Home w Home Health Services  In-House Referral:     Discharge planning Services  CM Consult  Post Acute Care Choice:  Durable Medical Equipment, Home Health Choice offered to:  Patient  DME Arranged:    DME Agency:     HH Arranged:  PT HH Agency:     Status of Service:  In process, will continue to follow  If discussed at Long Length of Stay Meetings, dates discussed:    Additional Comments:  Kingsley PlanWile, Trevionne Advani Marie, RN 04/21/2016, 10:47 AM

## 2016-04-21 NOTE — Discharge Instructions (Addendum)
Information on my medicine - ELIQUIS® (apixaban) ° °This medication education was reviewed with me or my healthcare representative as part of my discharge preparation.  The pharmacist that spoke with me during my hospital stay was:  Demarion Pondexter T Amey Hossain, RPH ° °Why was Eliquis® prescribed for you? °Eliquis® was prescribed for you to reduce the risk of blood clots forming after orthopedic surgery.   ° °What do You need to know about Eliquis®? °Take your Eliquis® TWICE DAILY - one tablet in the morning and one tablet in the evening with or without food.  It would be best to take the dose about the same time each day. ° °If you have difficulty swallowing the tablet whole please discuss with your pharmacist how to take the medication safely. ° °Take Eliquis® exactly as prescribed by your doctor and DO NOT stop taking Eliquis® without talking to the doctor who prescribed the medication.  Stopping without other medication to take the place of Eliquis® may increase your risk of developing a clot. ° °After discharge, you should have regular check-up appointments with your healthcare provider that is prescribing your Eliquis®. ° °What do you do if you miss a dose? °If a dose of ELIQUIS® is not taken at the scheduled time, take it as soon as possible on the same day and twice-daily administration should be resumed.  The dose should not be doubled to make up for a missed dose.  Do not take more than one tablet of ELIQUIS at the same time. ° °Important Safety Information °A possible side effect of Eliquis® is bleeding. You should call your healthcare provider right away if you experience any of the following: °? Bleeding from an injury or your nose that does not stop. °? Unusual colored urine (red or dark brown) or unusual colored stools (red or black). °? Unusual bruising for unknown reasons. °? A serious fall or if you hit your head (even if there is no bleeding). ° °Some medicines may interact with Eliquis® and might increase  your risk of bleeding or clotting while on Eliquis®. To help avoid this, consult your healthcare provider or pharmacist prior to using any new prescription or non-prescription medications, including herbals, vitamins, non-steroidal anti-inflammatory drugs (NSAIDs) and supplements. ° °This website has more information on Eliquis® (apixaban): http://www.eliquis.com/eliquis/home ° °

## 2016-04-21 NOTE — Discharge Summary (Signed)
Patient ID: Adam Watson MRN: [id number] DOB/AGE: [id number] y.o.  Admit date: 04/20/2016 Discharge date: 04/22/2016  Admission Diagnoses:  Principal Problem:   Post-traumatic osteoarthritis of right knee Active Problems:   Pulmonary nodules   PONV (postoperative nausea and vomiting)   Hypertension   GERD (gastroesophageal reflux disease)   COPD (chronic obstructive pulmonary disease) (HCC)   Discharge Diagnoses:  Same  Past Medical History:  Diagnosis Date  . Bicuspid aortic valve   . COPD (chronic obstructive pulmonary disease) (HCC)   . Diarrhea    hx  . GERD (gastroesophageal reflux disease)   . History of kidney stones   . History of stomach ulcers   . Hypertension    no meds  . PONV (postoperative nausea and vomiting)   . Post-traumatic osteoarthritis of right knee 04/07/2016  . Pulmonary nodules   . Wart    "inside my nose" (04/20/2016)  . WPW (Wolff-Parkinson-White syndrome)     Surgeries: Procedure(s): KNEE ARTHROPLASY WITH  MENISCECTOMY TOTAL KNEE on 04/20/2016   Consultants: Treatment Team:  Rounding Lbcardiology, MDhospitalist/cardiology  Discharged Condition: Improved  Hospital Course: Adam Watson is an 56 y.o. male who was admitted 04/20/2016 for operative treatment ofPost-traumatic osteoarthritis of right knee. Patient has severe unremitting pain that affects sleep, daily activities, and work/hobbies. After pre-op clearance the patient was taken to the operating room on 04/20/2016 and underwent  Procedure(s): KNEE ARTHROPLASY WITH  MENISCECTOMY TOTAL KNEE.   Patient became tachy late Monday night/early Tuesday am.  12 lead EKG ordered with changes compared to EKG on 1/22.  Cardiology consulted. D-dimer ordered and was positive so I ordered ct angio which was negative. Serial troponins ordered as well.  hospitalist consulted.  Will continue to monitor.  Patient was given perioperative antibiotics:  Anti-infectives    Start     Dose/Rate Route Frequency  Ordered Stop   04/20/16 2200  vancomycin (VANCOCIN) IVPB 1000 mg/200 mL premix     1,000 mg 200 mL/hr over 60 Minutes Intravenous Every 12 hours 04/20/16 1818 04/20/16 2250   04/20/16 0900  vancomycin (VANCOCIN) 1,500 mg in sodium chloride 0.9 % 500 mL IVPB     1,500 mg 250 mL/hr over 120 Minutes Intravenous To ShortStay Surgical 04/17/16 1417 04/20/16 1139       Patient was given sequential compression devices, early ambulation, and chemoprophylaxis to prevent DVT.  Patient benefited maximally from hospital stay and there were no complications.    Recent vital signs:  Patient Vitals for the past 24 hrs:  BP Temp Temp src Pulse Resp SpO2  04/22/16 0444 134/85 97.7 F (36.5 C) Oral 68 18 97 %  04/21/16 2054 (!) 132/97 98.1 F (36.7 C) Oral 93 18 96 %  04/21/16 1754 - - - 96 - -  04/21/16 1531 - - - (!) 135 - 94 %  04/21/16 1330 (!) 149/94 98.2 F (36.8 C) Oral 95 18 96 %  04/21/16 1013 115/83 98.3 F (36.8 C) Oral (!) 113 20 96 %     Recent laboratory studies:   Recent Labs  04/21/16 0125 04/22/16 0536  WBC 13.4* 16.8*  HGB 14.5 12.8*  HCT 41.1 37.5*  PLT 233 224  NA 136 135  K 3.9 4.7  CL 106 102  CO2 22 26  BUN 13 16  CREATININE 1.17 0.98  GLUCOSE 237* 176*  CALCIUM 9.2 9.1     Discharge Medications:   Allergies as of 04/22/2016      Reactions  Codeine Other (See Comments)   Eyes burning   Penicillins Hives, Rash      Medication List    TAKE these medications   acetaminophen 325 MG tablet Commonly known as:  TYLENOL Take 2 tablets (650 mg total) by mouth every 6 (six) hours as needed for mild pain (or Fever >/= 101).   apixaban 2.5 MG Tabs tablet Commonly known as:  ELIQUIS 1 tablet twice a day to prevent blood clots.  When finished with this prescription start Aspirin 81 mg a day   docusate sodium 100 MG capsule Commonly known as:  COLACE 1 tab 2 times a day while on narcotics.  STOOL SOFTENER   HYDROmorphone 2 MG tablet Commonly known as:   DILAUDID 1-2 tablets every 4-6 hrs as needed for pain   losartan-hydrochlorothiazide 50-12.5 MG tablet Commonly known as:  HYZAAR Take 1 tablet by mouth daily.   metoprolol succinate 50 MG 24 hr tablet Commonly known as:  TOPROL-XL Take 1 tablet (50 mg total) by mouth daily. Take with or immediately following a meal.   MULTIVITAMIN PO Take 1 tablet by mouth daily.   mupirocin ointment 2 % Commonly known as:  BACTROBAN Place 1 application into the nose 2 (two) times daily.   omeprazole 40 MG capsule Commonly known as:  PRILOSEC Take 40 mg by mouth daily.   polyethylene glycol packet Commonly known as:  MIRALAX / GLYCOLAX 17grams in 6 oz of water twice a day until bowel movement.  LAXITIVE.  Restart if two days since last bowel movement   potassium chloride 10 MEQ tablet Commonly known as:  K-DUR,KLOR-CON Take 10 mEq by mouth 2 (two) times daily.       Diagnostic Studies: Ct Angio Chest Pe W Or Wo Contrast  Result Date: 04/21/2016 CLINICAL DATA:  Tachycardia.  Post knee surgery. EXAM: CT ANGIOGRAPHY CHEST WITH CONTRAST TECHNIQUE: Multidetector CT imaging of the chest was performed using the standard protocol during bolus administration of intravenous contrast. Multiplanar CT image reconstructions and MIPs were obtained to evaluate the vascular anatomy. CONTRAST:  100 cc Isovue 370 IV COMPARISON:  Chest CT 08/20/2015, and 05/14/2014 FINDINGS: Cardiovascular: There are no filling defects within the pulmonary arteries to the distal segmental level to suggest pulmonary embolus. No evidence of aortic dissection. The heart is normal in size. Mediastinum/Nodes: Small mediastinal nodes, not enlarged by size criteria. No hilar adenopathy. No axillary adenopathy. Lungs/Pleura: Scattered linear atelectasis throughout both lungs. Mild dependent atelectasis in the right lower lobe. Right lower lobe pulmonary nodule measures 10 x 5 mm, unchanged to slightly decreased in size from prior CTs. Tiny  subpleural nodule in the right upper lobe image 62 series 5 is unchanged. A more central small pulmonary nodule on prior CT in the right upper lobe is not currently visualized. No evidence of pulmonary edema. No pleural fluid. Trachea and mainstem bronchi are patent. Upper Abdomen: No acute abnormality. Musculoskeletal: There are no acute or suspicious osseous abnormalities. Review of the MIP images confirms the above findings. IMPRESSION: 1. No pulmonary embolus. 2. Scattered atelectasis. 3. Stable right lower lobe pulmonary nodules over the course of 2 years. Electronically Signed   By: Rubye Oaks M.D.   On: 04/21/2016 06:01    Disposition: Final discharge disposition not confirmed  Discharge Instructions    CPM    Complete by:  As directed    Continuous passive motion machine (CPM):      Use the CPM from 0 to 90 for 6 hours per  day.       You may break it up into 2 or 3 sessions per day.      Use CPM for 2 weeks or until you are told to stop.   Call MD / Call 911    Complete by:  As directed    If you experience chest pain or shortness of breath, CALL 911 and be transported to the hospital emergency room.  If you develope a fever above 101 F, pus (white drainage) or increased drainage or redness at the wound, or calf pain, call your surgeon's office.   Change dressing    Complete by:  As directed    Change the gauze dressing daily with sterile 4 x 4 inch gauze and apply TED hose.  DO NOT REMOVE BANDAGE OVER SURGICAL INCISION.  WASH WHOLE LEG INCLUDING OVER THE WATERPROOF BANDAGE WITH SOAP AND WATER EVERY DAY.   Constipation Prevention    Complete by:  As directed    Drink plenty of fluids.  Prune juice may be helpful.  You may use a stool softener, such as Colace (over the counter) 100 mg twice a day.  Use MiraLax (over the counter) for constipation as needed.   Diet - low sodium heart healthy    Complete by:  As directed    Discharge instructions    Complete by:  As directed     INSTRUCTIONS AFTER JOINT REPLACEMENT   Remove items at home which could result in a fall. This includes throw rugs or furniture in walking pathways ICE to the affected joint every three hours while awake for 30 minutes at a time, for at least the first 3-5 days, and then as needed for pain and swelling.  Continue to use ice for pain and swelling. You may notice swelling that will progress down to the foot and ankle.  This is normal after surgery.  Elevate your leg when you are not up walking on it.   Continue to use the breathing machine you got in the hospital (incentive spirometer) which will help keep your temperature down.  It is common for your temperature to cycle up and down following surgery, especially at night when you are not up moving around and exerting yourself.  The breathing machine keeps your lungs expanded and your temperature down.   DIET:  As you were doing prior to hospitalization, we recommend a well-balanced diet.  DRESSING / WOUND CARE / SHOWERING  Keep the surgical dressing until follow up.  The dressing is water proof, so you can shower without any extra covering.  IF THE DRESSING FALLS OFF or the wound gets wet inside, change the dressing with sterile gauze.  Please use good hand washing techniques before changing the dressing.  Do not use any lotions or creams on the incision until instructed by your surgeon.    ACTIVITY  Increase activity slowly as tolerated, but follow the weight bearing instructions below.   No driving for 6 weeks or until further direction given by your physician.  You cannot drive while taking narcotics.  No lifting or carrying greater than 10 lbs. until further directed by your surgeon. Avoid periods of inactivity such as sitting longer than an hour when not asleep. This helps prevent blood clots.  You may return to work once you are authorized by your doctor.     WEIGHT BEARING   Weight bearing as tolerated with assist device (walker, cane,  etc) as directed, use it as long as suggested  by your surgeon or therapist, typically at least 2-3 weeks.   EXERCISES  Results after joint replacement surgery are often greatly improved when you follow the exercise, range of motion and muscle strengthening exercises prescribed by your doctor. Safety measures are also important to protect the joint from further injury. Any time any of these exercises cause you to have increased pain or swelling, decrease what you are doing until you are comfortable again and then slowly increase them. If you have problems or questions, call your caregiver or physical therapist for advice.   Rehabilitation is important following a joint replacement. After just a few days of immobilization, the muscles of the leg can become weakened and shrink (atrophy).  These exercises are designed to build up the tone and strength of the thigh and leg muscles and to improve motion. Often times heat used for twenty to thirty minutes before working out will loosen up your tissues and help with improving the range of motion but do not use heat for the first two weeks following surgery (sometimes heat can increase post-operative swelling).   These exercises can be done on a training (exercise) mat, on the floor, on a table or on a bed. Use whatever works the best and is most comfortable for you.    Use music or television while you are exercising so that the exercises are a pleasant break in your day. This will make your life better with the exercises acting as a break in your routine that you can look forward to.   Perform all exercises about fifteen times, three times per day or as directed.  You should exercise both the operative leg and the other leg as well.   Exercises include:  Quad Sets - Tighten up the muscle on the front of the thigh (Quad) and hold for 5-10 seconds.   Straight Leg Raises - With your knee straight (if you were given a brace, keep it on), lift the leg to 60  degrees, hold for 3 seconds, and slowly lower the leg.  Perform this exercise against resistance later as your leg gets stronger.  Leg Slides: Lying on your back, slowly slide your foot toward your buttocks, bending your knee up off the floor (only go as far as is comfortable). Then slowly slide your foot back down until your leg is flat on the floor again.  Angel Wings: Lying on your back spread your legs to the side as far apart as you can without causing discomfort.  Hamstring Strength:  Lying on your back, push your heel against the floor with your leg straight by tightening up the muscles of your buttocks.  Repeat, but this time bend your knee to a comfortable angle, and push your heel against the floor.  You may put a pillow under the heel to make it more comfortable if necessary.   A rehabilitation program following joint replacement surgery can speed recovery and prevent re-injury in the future due to weakened muscles. Contact your doctor or a physical therapist for more information on knee rehabilitation.    CONSTIPATION  Constipation is defined medically as fewer than three stools per week and severe constipation as less than one stool per week.  Even if you have a regular bowel pattern at home, your normal regimen is likely to be disrupted due to multiple reasons following surgery.  Combination of anesthesia, postoperative narcotics, change in appetite and fluid intake all can affect your bowels.   YOU MUST use at least  one of the following options; they are listed in order of increasing strength to get the job done.  They are all available over the counter, and you may need to use some, POSSIBLY even all of these options:    Drink plenty of fluids (prune juice may be helpful) and high fiber foods Colace 100 mg by mouth twice a day  Senokot for constipation as directed and as needed Dulcolax (bisacodyl), take with full glass of water  Miralax (polyethylene glycol) once or twice a day as  needed.  If you have tried all these things and are unable to have a bowel movement in the first 3-4 days after surgery call either your surgeon or your primary doctor.    If you experience loose stools or diarrhea, hold the medications until you stool forms back up.  If your symptoms do not get better within 1 week or if they get worse, check with your doctor.  If you experience "the worst abdominal pain ever" or develop nausea or vomiting, please contact the office immediately for further recommendations for treatment.   ITCHING:  If you experience itching with your medications, try taking only a single pain pill, or even half a pain pill at a time.  You can also use Benadryl over the counter for itching or also to help with sleep.   TED HOSE STOCKINGS:  Use stockings on both legs until for at least 2 weeks or as directed by physician office. They may be removed at night for sleeping.  MEDICATIONS:  See your medication summary on the "After Visit Summary" that nursing will review with you.  You may have some home medications which will be placed on hold until you complete the course of blood thinner medication.  It is important for you to complete the blood thinner medication as prescribed.  PRECAUTIONS:  If you experience chest pain or shortness of breath - call 911 immediately for transfer to the hospital emergency department.   If you develop a fever greater that 101 F, purulent drainage from wound, increased redness or drainage from wound, foul odor from the wound/dressing, or calf pain - CONTACT YOUR SURGEON.                                                   FOLLOW-UP APPOINTMENTS:  If you do not already have a post-op appointment, please call the office for an appointment to be seen by your surgeon.  Guidelines for how soon to be seen are listed in your "After Visit Summary", but are typically between 1-4 weeks after surgery.  OTHER INSTRUCTIONS:   Knee Replacement:  Do not place pillow  under knee, focus on keeping the knee straight while resting. CPM instructions: 0-90 degrees, 2 hours in the morning, 2 hours in the afternoon, and 2 hours in the evening. Place foam block, curve side up under heel at all times except when in CPM or when walking.  DO NOT modify, tear, cut, or change the foam block in any way.  MAKE SURE YOU:  Understand these instructions.  Get help right away if you are not doing well or get worse.    Thank you for letting us be a part of your medical care team.  It is a privilege we respect greatly.  We hope these instructions will help you stay on  track for a fast and full recovery!   Do not put a pillow under the knee. Place it under the heel.    Complete by:  As directed    Place gray foam block, curve side up under heel at all times except when in CPM or when walking.  DO NOT modify, tear, cut, or change in any way the gray foam block.   Increase activity slowly as tolerated    Complete by:  As directed    Patient may shower    Complete by:  As directed    Aquacel dressing is water proof    Wash over it and the whole leg with soap and water at the end of your shower   TED hose    Complete by:  As directed    Use stockings (TED hose) for 2 weeks on both leg(s).  You may remove them at night for sleeping.      Follow-up Information    Rollene Rotunda, MD Follow up on 05/11/2016.   Specialty:  Cardiology Why:  at 10:00 for cardiology hospital follow up. Please bring all of you medications with you to this appointment. Contact information: [address] [place] [address] [phone]            Signed: Otilio Saber 04/22/2016, 7:38 AM

## 2016-04-21 NOTE — Evaluation (Signed)
Physical Therapy Evaluation Patient Details Name: Adam Watson MRN: [id number] DOB: 1960/04/18 Today's Date: 04/21/2016   History of Present Illness  HPI  Underwent right TKA with meniscectomy on 04/20/16.    Clinical Impression  Pt admitted with above diagnosis. Pt currently with functional limitations due to the deficits listed below (see PT Problem List). Pt was able to ambulate with cues for sequencing with RW with overall good safety.  Pt limited by HR of 141 bpm with activity. Nurse aware.  Sats >90% on RA therefore removed O2 as pt was on 2L on arrival to room.  Will follow acutely.  Pt will benefit from skilled PT to increase their independence and safety with mobility to allow discharge to the venue listed below.      Follow Up Recommendations Home health PT;Supervision - Intermittent    Equipment Recommendations  Rolling walker with 5" wheels;3in1 (PT) (Pt is 6'-may need tall equipment; CPM)    Recommendations for Other Services       Precautions / Restrictions Precautions Precautions: Fall;Knee Precaution Booklet Issued: Yes (comment) Required Braces or Orthoses: Knee Immobilizer - Right Knee Immobilizer - Right: On when out of bed or walking;Discontinue once straight leg raise with < 10 degree lag Restrictions Weight Bearing Restrictions: Yes RLE Weight Bearing: Weight bearing as tolerated      Mobility  Bed Mobility Overal bed mobility: Independent                Transfers Overall transfer level: Needs assistance Equipment used: Rolling walker (2 wheeled) Transfers: Sit to/from Stand Sit to Stand: Supervision         General transfer comment: cues for hand placment  Ambulation/Gait Ambulation/Gait assistance: Min guard;Supervision Ambulation Distance (Feet): 165 Feet Assistive device: Rolling walker (2 wheeled) Gait Pattern/deviations: Step-to pattern;Decreased step length - right;Decreased stance time - right;Decreased weight shift to  right;Antalgic   Gait velocity interpretation: Below normal speed for age/gender General Gait Details: Pt needed cues for sequencing steps and RW.  Overall did well.  HR up to 141 bpm therefore limited ambulation and nursing made aware.    Stairs            Wheelchair Mobility    Modified Rankin (Stroke Patients Only)       Balance Overall balance assessment: Needs assistance;History of Falls Sitting-balance support: No upper extremity supported;Feet supported Sitting balance-Leahy Scale: Fair     Standing balance support: Bilateral upper extremity supported;During functional activity Standing balance-Leahy Scale: Fair Standing balance comment: can stand staticaly without UE support for up to a minute.                               Pertinent Vitals/Pain Pain Assessment: 0-10 Pain Score: 5  Pain Location: right knee Pain Descriptors / Indicators: Aching;Grimacing;Guarding;Operative site guarding Pain Intervention(s): Monitored during session;Limited activity within patient's tolerance;Repositioned;Ice applied  HR 112 -141 bpm, O2 sats on RA 90-94%.    Home Living Family/patient expects to be discharged to:: Private residence Living Arrangements: Spouse/significant other Available Help at Discharge: Family;Available PRN/intermittently (wife works but taking 2 weeks off) Type of Home: House Home Access: Stairs to enter Entrance Stairs-Rails: Left Entrance Stairs-Number of Steps: 3 Home Layout: One level Home Equipment: None      Prior Function Level of Independence: Independent               Hand Dominance        Extremity/Trunk  Assessment   Upper Extremity Assessment Upper Extremity Assessment: Defer to OT evaluation    Lower Extremity Assessment Lower Extremity Assessment: RLE deficits/detail RLE Deficits / Details: knee 3-/5, ankle 4/5 and hip 4/5    Cervical / Trunk Assessment Cervical / Trunk Assessment: Normal  Communication    Communication: No difficulties  Cognition Arousal/Alertness: Awake/alert Behavior During Therapy: WFL for tasks assessed/performed Overall Cognitive Status: Within Functional Limits for tasks assessed                      General Comments      Exercises Total Joint Exercises Ankle Circles/Pumps: AROM;Both;10 reps;Supine Quad Sets: AROM;Both;10 reps;Supine Towel Squeeze: AROM;Both;10 reps;Supine Heel Slides: AAROM;Right;5 reps;Supine Hip ABduction/ADduction: AROM;Right;10 reps;Supine Straight Leg Raises: AROM;Right;10 reps;Supine Long Arc Quad: AROM;Right;10 reps;Seated Knee Flexion: AROM;Right;10 reps;Seated Goniometric ROM: 5-80 degrees   Assessment/Plan    PT Assessment Patient needs continued PT services  PT Problem List Decreased strength;Decreased activity tolerance;Decreased balance;Decreased mobility;Decreased knowledge of use of DME;Decreased safety awareness;Decreased knowledge of precautions;Decreased range of motion;Pain          PT Treatment Interventions DME instruction;Gait training;Stair training;Functional mobility training;Therapeutic activities;Balance training;Therapeutic exercise;Patient/family education    PT Goals (Current goals can be found in the Care Plan section)  Acute Rehab PT Goals Patient Stated Goal: to get better PT Goal Formulation: With patient Time For Goal Achievement: 04/28/16 Potential to Achieve Goals: Good    Frequency 7X/week   Barriers to discharge        Co-evaluation               End of Session Equipment Utilized During Treatment: Gait belt;Oxygen Activity Tolerance: Patient limited by fatigue Patient left: in chair;with call bell/phone within reach Nurse Communication: Mobility status         Time: 0921-0955 PT Time Calculation (min) (ACUTE ONLY): 34 min   Charges:   PT Evaluation $PT Eval Moderate Complexity: 1 Procedure PT Treatments $Gait Training: 8-22 mins   PT G Codes:        Berline Lopes 05-19-2016, 11:36 AM  [facility] [phone] (pager)

## 2016-04-21 NOTE — Evaluation (Signed)
Occupational Therapy Evaluation Patient Details Name: Adam Watson MRN: [id number] DOB: [birth year] Today's Date: 04/21/2016    History of Present Illness HPI  Underwent right TKA with meniscectomy on 04/20/16.    Past Medical History:  Diagnosis Date  . Bicuspid aortic valve   . COPD (chronic obstructive pulmonary disease) (HCC)   . Diarrhea    hx  . GERD (gastroesophageal reflux disease)   . History of kidney stones   . History of stomach ulcers   . Hypertension    no meds  . PONV (postoperative nausea and vomiting)   . Post-traumatic osteoarthritis of right knee 04/07/2016  . Pulmonary nodules   . Wart    "inside my nose" (04/20/2016)  . WPW (Wolff-Parkinson-White syndrome)       Clinical Impression   Patient evaluated by Occupational Therapy with no further acute OT needs identified. All education has been completed and the patient has no further questions. See below for any follow-up Occupational Therapy or equipment needs. OT to sign off. Thank you for referral.      Follow Up Recommendations  No OT follow up    Equipment Recommendations       Recommendations for Other Services       Precautions / Restrictions Precautions Precautions: Fall;Knee Precaution Booklet Issued: Yes (comment) Required Braces or Orthoses: Knee Immobilizer - Right Knee Immobilizer - Right: On when out of bed or walking;Discontinue once straight leg raise with < 10 degree lag Restrictions Weight Bearing Restrictions: Yes RLE Weight Bearing: Weight bearing as tolerated      Mobility Bed Mobility Overal bed mobility: Independent             General bed mobility comments: in chair on arrival  Transfers Overall transfer level: Needs assistance Equipment used: Rolling walker (2 wheeled) Transfers: Sit to/from Stand Sit to Stand: Supervision         General transfer comment: cues for hand placment    Balance Overall balance assessment: Needs assistance;History of  Falls Sitting-balance support: No upper extremity supported;Feet supported Sitting balance-Leahy Scale: Fair     Standing balance support: Bilateral upper extremity supported;During functional activity Standing balance-Leahy Scale: Fair Standing balance comment: can stand staticaly without UE support for up to a minute.                              ADL Overall ADL's : Modified independent                                       General ADL Comments: pt educated on tub transfer with use of 3n1 and demonstration. pt noted to have HR 108-125 during session     Vision     Perception     Praxis      Pertinent Vitals/Pain Pain Assessment: 0-10 Pain Score: 3  Pain Location: right knee Pain Descriptors / Indicators: Aching;Grimacing;Guarding;Operative site guarding Pain Intervention(s): Monitored during session;Premedicated before session;Repositioned;Ice applied     Hand Dominance Right   Extremity/Trunk Assessment Upper Extremity Assessment Upper Extremity Assessment: Generalized weakness   Lower Extremity Assessment Lower Extremity Assessment: Defer to PT evaluation RLE Deficits / Details: knee 3-/5, ankle 4/5 and hip 4/5   Cervical / Trunk Assessment Cervical / Trunk Assessment: Normal   Communication Communication Communication: No difficulties   Cognition Arousal/Alertness: Awake/alert Behavior During Therapy: [facility]  for tasks assessed/performed Overall Cognitive Status: Within Functional Limits for tasks assessed                     General Comments       Exercises Exercises: Total Joint     Shoulder Instructions      Home Living Family/patient expects to be discharged to:: Private residence Living Arrangements: Spouse/significant other Available Help at Discharge: Family;Available PRN/intermittently Type of Home: House Home Access: Stairs to enter Entergy CorporationEntrance Stairs-Number of Steps: 3 Entrance Stairs-Rails: Left Home  Layout: One level     Bathroom Shower/Tub: Chief Strategy OfficerTub/shower unit   Bathroom Toilet: Standard Bathroom Accessibility: Yes   Home Equipment: None          Prior Functioning/Environment Level of Independence: Independent                 OT Problem List:     OT Treatment/Interventions:      OT Goals(Current goals can be found in the care plan section) Acute Rehab OT Goals Patient Stated Goal: to get better  OT Frequency:     Barriers to D/C:            Co-evaluation              End of Session Equipment Utilized During Treatment: Rolling walker;Right knee immobilizer CPM Right Knee CPM Right Knee: Off Nurse Communication: Mobility status;Precautions  Activity Tolerance: Patient tolerated treatment well Patient left: in chair;with call bell/phone within reach   Time: 1033-1102 OT Time Calculation (min): 29 min Charges:  OT General Charges $OT Visit: 1 Procedure OT Evaluation $OT Eval Moderate Complexity: 1 Procedure G-Codes:    Boone MasterJones, Maybelle Depaoli B 04/21/2016, 1:15 PM    Mateo FlowJones, Brynn   OTR/L Pager: [phone] Office: [phone] .

## 2016-04-22 LAB — BASIC METABOLIC PANEL
Anion gap: 7 (ref 5–15)
BUN: 16 mg/dL (ref 6–20)
CO2: 26 mmol/L (ref 22–32)
Calcium: 9.1 mg/dL (ref 8.9–10.3)
Chloride: 102 mmol/L (ref 101–111)
Creatinine, Ser: 0.98 mg/dL (ref 0.61–1.24)
GFR calc Af Amer: >60 mL/min (ref >60)
GFR calc non Af Amer: >60 mL/min (ref >60)
Glucose, Bld: 176 mg/dL — ABNORMAL HIGH (ref 65–99)
Potassium: 4.7 mmol/L (ref 3.5–5.1)
Sodium: 135 mmol/L (ref 135–145)

## 2016-04-22 LAB — CBC
HCT: 37.5 % — ABNORMAL LOW (ref 39.0–52.0)
Hemoglobin: 12.8 g/dL — ABNORMAL LOW (ref 13.0–17.0)
MCH: 31.7 pg (ref 26.0–34.0)
MCHC: 34.1 g/dL (ref 30.0–36.0)
MCV: 92.8 fL (ref 78.0–100.0)
Platelets: 224 10*3/uL (ref 150–400)
RBC: 4.04 MIL/uL — ABNORMAL LOW (ref 4.22–5.81)
RDW: 13.6 % (ref 11.5–15.5)
WBC: 16.8 10*3/uL — ABNORMAL HIGH (ref 4.0–10.5)

## 2016-04-22 MED ORDER — METOPROLOL SUCCINATE ER 50 MG PO TB24: 50.0000 mg | ORAL_TABLET | Freq: Every day | ORAL | 0 refills | Status: DC

## 2016-04-22 NOTE — Care Management Note (Signed)
Case Management Note  Patient Details  Name: Arnoldo MoraleJohn R Duncombe MRN: [id number] Date of Birth: [birth year]  Subjective/Objective:                    Action/Plan:  Please see previous note .   Workers Comp case Doctor, general practicemanager [place] has forward home health orders and DME orders to [facility] who will arrange home health and delivery of DME.   Called Tyree Fluharty at [facility] phone number [phone] ext [phone] for update on status.  [facility] also , and was told therapy will start in home tomorrow 04-23-16 . However, patient has not received phone call on delivery of DME . Nycole Kawahara told patient she will check on DME . Awaiting call back.  Expected Discharge Date:  04/22/16               Expected Discharge Plan:  Home w Home Health Services  In-House Referral:     Discharge planning Services  CM Consult  Post Acute Care Choice:  Durable Medical Equipment, Home Health Choice offered to:  Patient  DME Arranged:    DME Agency:     HH Arranged:  PT, OT HH Agency:     Status of Service:  In process, will continue to follow  If discussed at Long Length of Stay Meetings, dates discussed:    Additional Comments:  Kingsley PlanWile, Kinzly Pierrelouis Marie, RN 04/22/2016, 10:01 AM

## 2016-04-22 NOTE — Progress Notes (Signed)
Patient discharged to home with instructions and prescriptions, all equipments sent with patient.

## 2016-04-22 NOTE — Progress Notes (Signed)
Physical Therapy Discharge Patient Details Name: Adam Watson MRN: [id number] DOB: Nov 09, 1960 Today's Date: 04/22/2016 Time: 4383-7793 PT Time Calculation (min) (ACUTE ONLY): 29 min  Patient discharged from PT services secondary to goals met and no further PT needs identified.  Please see latest therapy progress note for current level of functioning and progress toward goals.    Progress and discharge plan discussed with patient and/or caregiver: Patient/Caregiver agrees with plan  GP     [name] 04/22/2016, 9:34 AM  [name] [phone] (pager)

## 2016-04-22 NOTE — Progress Notes (Signed)
Physical Therapy Treatment And D/C Patient Details Name: Adam Watson MRN: [id number] DOB: 1960-04-10 Today's Date: 04/22/2016    History of Present Illness 56 y.o. male admitted to [facility] on 04/20/16 for elective R TKA with menisectomy.  Pt with post op tachycardia.  Pt with significant PMHx of HTN, COPD, bicuspid aortic valve, multilple right knee surgeries, cervical spine surgery, and back surgery.    PT Comments    Pt admitted with above diagnosis. Pt currently without significant  functional limitations as pt has met all inpatient goals and is going home today.  Will continue therapy at home with HHPT.  Pt Modif I to Supervision with activity.    Follow Up Recommendations  Home health PT;Supervision - Intermittent     Equipment Recommendations  Rolling walker with 5" wheels;3in1 (PT) (ice and CPM being delivered to home per pt)    Recommendations for Other Services       Precautions / Restrictions Precautions Precautions: Knee Precaution Booklet Issued: Yes (comment) Precaution Comments: knee exercise handout given in previous session, no pillow under operative knee reviewed Restrictions Weight Bearing Restrictions: Yes RLE Weight Bearing: Weight bearing as tolerated    Mobility  Bed Mobility Overal bed mobility: Needs Assistance Bed Mobility: Supine to Sit;Sit to Supine     Supine to sit: Modified independent (Device/Increase time) Sit to supine: Modified independent (Device/Increase time)   General bed mobility comments: Pt able to move into and out of bed on his own.   Transfers Overall transfer level: Needs assistance Equipment used: Rolling walker (2 wheeled) Transfers: Sit to/from Stand Sit to Stand: Modified independent (Device/Increase time)         General transfer comment: No assist or cues given.   Ambulation/Gait Ambulation/Gait assistance: Supervision;Modified independent (Device/Increase time) Ambulation Distance (Feet): 380 Feet Assistive device:  Rolling walker (2 wheeled) Gait Pattern/deviations: Step-to pattern;Antalgic Gait velocity: decreased Gait velocity interpretation: Below normal speed for age/gender General Gait Details: Pt with minimally antalgic gait pattern, no cues needed for safe RW use or for sequencing.  Pt's HR 96-104 bpm during gait.  O2 sats 94% or higher on RA during gait.     Stairs Stairs: Yes   Stair Management: One rail Left;Step to pattern;Forwards;With walker Number of Stairs: 3 General stair comments: PRacticed up and down with RW as well as with left rail only.  Pt needs no assist or cues with either.    Wheelchair Mobility    Modified Rankin (Stroke Patients Only)       Balance Overall balance assessment: Needs assistance Sitting-balance support: Feet supported;No upper extremity supported Sitting balance-Leahy Scale: Good     Standing balance support: Bilateral upper extremity supported;No upper extremity supported;Single extremity supported Standing balance-Leahy Scale: Fair Standing balance comment: can stand staticaly without UE support for up to a minute.               High level balance activites: Direction changes;Turns;Sudden stops High Level Balance Comments: All of above with Modif I.     Cognition Arousal/Alertness: Awake/alert Behavior During Therapy: WFL for tasks assessed/performed Overall Cognitive Status: Within Functional Limits for tasks assessed                      Exercises Total Joint Exercises Ankle Circles/Pumps: AROM;Both;20 reps Quad Sets: AROM;Right;10 reps Hip ABduction/ADduction: AROM;Right;10 reps Straight Leg Raises: AROM;Right;10 reps;Supine Long Arc Quad: AROM;Right;10 reps;Seated Knee Flexion: AROM;Right;10 reps;Seated Goniometric ROM: 5-83 degrees    General Comments  Pertinent Vitals/Pain Pain Assessment: 0-10 Pain Score: 2  Pain Location: right knee Pain Descriptors / Indicators: Aching;Grimacing;Guarding;Operative  site guarding Pain Intervention(s): Limited activity within patient's tolerance;Monitored during session;Premedicated before session;Repositioned  VSS    Home Living                      Prior Function            PT Goals (current goals can now be found in the care plan section) Acute Rehab PT Goals Patient Stated Goal: to get better Progress towards PT goals: Progressing toward goals    Frequency    7X/week      PT Plan Current plan remains appropriate    Co-evaluation             End of Session Equipment Utilized During Treatment: Gait belt Activity Tolerance: Patient tolerated treatment well Patient left: with call bell/phone within reach;in chair     Time: 5583-1674 PT Time Calculation (min) (ACUTE ONLY): 29 min  Charges:  $Gait Training: 8-22 mins $Therapeutic Exercise: 8-22 mins                    G Codes:      [name] 2016-04-27, 9:31 AM [facility] [phone] (pager)

## 2016-04-22 NOTE — Progress Notes (Signed)
Subjective: 2 Days Post-Op Procedure(s) (LRB): KNEE ARTHROPLASY WITH  MENISCECTOMY (Right) TOTAL KNEE (Right) Patient reports pain as mild.  Chest pain/palpitations/sob, nausea/vomiting, lightheadedness/dizziness.    Objective: Vital signs in last 24 hours: Temp:  [97.7 F (36.5 C)-98.3 F (36.8 C)] 97.7 F (36.5 C) (01/31 0444) Pulse Rate:  [68-135] 68 (01/31 0444) Resp:  [18-20] 18 (01/31 0444) BP: (115-149)/(83-97) 134/85 (01/31 0444) SpO2:  [94 %-97 %] 97 % (01/31 0444)  Intake/Output from previous day: 01/30 0701 - 01/31 0700 In: 710 [P.O.:360; I.V.:350] Out: 2050 [Urine:2050] Intake/Output this shift: No intake/output data recorded.   Recent Labs  04/21/16 0125 04/22/16 0536  HGB 14.5 12.8*    Recent Labs  04/21/16 0125 04/22/16 0536  WBC 13.4* 16.8*  RBC 4.55 4.04*  HCT 41.1 37.5*  PLT 233 224    Recent Labs  04/21/16 0125 04/22/16 0536  NA 136 135  K 3.9 4.7  CL 106 102  CO2 22 26  BUN 13 16  CREATININE 1.17 0.98  GLUCOSE 237* 176*  CALCIUM 9.2 9.1   No results for input(s): LABPT, INR in the last 72 hours.  Neurologically intact Neurovascular intact Sensation intact distally Intact pulses distally Dorsiflexion/Plantar flexion intact Incision: dressing C/D/I No cellulitis present Compartment soft  Assessment/Plan: 2 Days Post-Op Procedure(s) (LRB): KNEE ARTHROPLASY WITH  MENISCECTOMY (Right) TOTAL KNEE (Right) Advance diet Up with therapy D/C IV fluids Discharge home with home health after PT WBAT RLE Please remove ACE bandage and apply ted hose to BLE prior to d/c ABLA-mild and stable Cleared by cards to d/c today   Otilio SaberM Lindsey Stanbery 04/22/2016, 7:35 AM

## 2016-04-22 NOTE — Op Note (Signed)
NAME:  Adam Watson, Adam Watson NO.:  [id number]  MEDICAL RECORD NO.:  [id number]  LOCATION:                                 FACILITY:  PHYSICIAN:  Elana Alm. Thurston Hole, M.D. DATE OF BIRTH:  12/01/1960  DATE OF PROCEDURE:  04/20/2016 DATE OF DISCHARGE:                              OPERATIVE REPORT   PREOPERATIVE DIAGNOSIS:  Right knee posttraumatic localized arthritis.  POSTOPERATIVE DIAGNOSIS:  Right knee posttraumatic localized arthritis.  PROCEDURE: 1. Right knee diagnostic arthroscopy. 2. Right total knee replacement using DePuy Attune total knee system     with #7 cemented femur, #7 cemented tibia with 6 mm polyethylene RP     tibial spacer, and 35 mm polyethylene cemented patella.  SURGEON:  Elana Alm. Thurston Hole, M.D.  ASSISTANT:  Kirstin Shepperson, PA-C.  ANESTHESIA:  Spinal.  OPERATIVE TIME:  1 hour and 30 minutes.  COMPLICATIONS:  None.  DESCRIPTION OF PROCEDURE:  Adam Watson was brought to the operating room on April 20, 2016, after an adductor canal block had been placed in the holding room by Anesthesia.  He was placed on the operative table in a supine position.  After an adequate spinal anesthetic was administered by Anesthesia, his right knee was examined.  He had full range of motion, moderate varus deformity, knee was stable to ligamentous exam with normal patellar tracking.  The right leg was prepped using sterile DuraPrep and draped using sterile technique.  Time-out procedure was called and the correct right knee identified.  He received antibiotics preoperatively for prophylaxis.  Initially the arthroscopy was performed to determine whether he was a candidate for an unicompartmental arthroplasty versus a total knee replacement.  Through an anterolateral portal, the arthroscope with a pump attached was placed.  On initial inspection of the medial compartment, he was found to have 75% grade 3 and 20% grade 4 posttraumatic arthritis noted.  Medial  meniscus showed remnants from a previous partial meniscectomy with no new meniscal tearing.  Intercondylar notch showed anterior and posterior cruciate ligaments were intact.  The lateral compartment showed 40% to 50% grade 3 chondromalacia on the lateral tibial plateau, grade 1 to 2 changes in the lateral femoral condyle, lateral meniscus was intact. Patellofemoral joint showed 50% to 60% grade 3 chondromalacia.  Because of this, I did not feel that he was an unicompartmental candidate.  At this point, I did feel that it was necessary to proceed with a total knee replacement.  The arthroscope was removed.  The right leg was then exsanguinated and a tourniquet elevated to 365 mm.  Initially through a 6 to 7 cm longitudinal incision based over the patella, initial exposure was made.  The underlying subcutaneous tissues were incised in line with the skin incision.  The median arthrotomy was performed revealing an excessive amount of normal-appearing joint fluid.  The articular surfaces were again inspected and the same findings were found that were found at the time of arthroscopy with both grade 3 and 4 changes medially, grade 3 changes laterally and in the patellofemoral joint. The medial and lateral meniscal remnants were removed as well as the anterior cruciate ligament.  The intramedullary  drill was then drilled up the femoral canal for placement of the distal femoral cutting jig, which was placed in the appropriate amount of rotation and a distal 9 mm cut was made.  The proximal tibia was then exposed.  The tibial cutting jig was placed in the appropriate manner of rotation and the proximal tibial cut was made removing 4 mm off the medial side, 6 mm off the lateral side to accommodate for his varus deformity using this cutting jig.  After this was done, then the distal femur was sized for the chamfer cuts.  #7 was found to be the appropriate size and #7 cutting jig was placed in the  appropriate manner of rotation and the anterior, posterior, and chamfer cuts were made.  After this was done, the proximal tibia was then exposed and a #7 tibial base plate trial was placed, found to be an excellent fit and the keel cut was made.  The PCL box cutter was then placed on the distal femur and these cuts were made. After this was done with a #7 trial femoral trial in place, a #7 tibial base plate trial in place, and a 6 mm polyethylene RP tibial spacer, the knee was reduced, taken through a full range of motion, found to be stable with excellent correction of his varus deformity with normal patellar tracking.  A resurfacing 9 mm cut was then made on the patella and 3 locking holes placed for a 35 mm polyethylene patella trial. Again patellofemoral tracking was evaluated and found to be normal.  At this point, it was felt that all the trial components were of excellent size, fit, and stability.  They were then removed.  The knee was then jet lavage irrigated with saline and then the proximal tibia was exposed and a #7 tibial base plate with cement backing was hammered in position with an excellent fit with excess cement being removed from around the edges.  #7 femoral component with cement backing was hammered in position also with an excellent fit with excess cement being removed from around the edges.  The 6 mm polyethylene RP tibial spacer was then placed and the knee reduced, taken through a full range of motion, found to be stable with excellent correction of his varus deformity.  The 35 mm polyethylene cement backed patella was then placed in position and held there with a clamp.  After the cement hardened, again patellofemoral tracking was evaluated and found to be normal.  At this point, it was felt that all the components were of excellent size, fit, and stability.  The wound was further irrigated with saline and then the arthrotomy was closed with both 0 and #1 Vicryl.   Subcutaneous tissues were closed with 0 and 2-0 Vicryl.  Subcuticular layer closed with 4-0 Monocryl.  The knee was then injected with 0.5% Marcaine with epinephrine.  Sterile dressings were applied and tourniquet was released.  Long-leg splint applied, then the patient awakened, taken to the recovery room in a stable condition.  Needle and sponge counts were correct x2 at the end of the case.  Neurovascular status was normal.     Keelyn Monjaras A. Thurston HoleWainer, M.D.   ______________________________ Elana Almobert A. Thurston HoleWainer, M.D.    RAW/MEDQ  D:  04/20/2016  T:  04/21/2016  Job:  [id number]

## 2016-05-10 NOTE — Progress Notes (Deleted)
Cardiology Office Note   Date:  05/10/2016   ID:  Adam Watson, DOB 1960/09/26, MRN [id number]  PCP:  [name], MD  Cardiologist:   Rollene Rotunda, MD  Referring:  Dr. Thurston Hole  No chief complaint on file.     History of Present Illness: Adam Watson is a 56 y.o. male who has a history of aortic stenosis with bicuspid valve and WPW history with ablation at [facility] in 1990's.  I saw him prior to knee surgery.  He had an echo with no significant stenosis and trivial AI and mildly dilated aortic root.  He had surgery and we were called secondary to tachycardia.  No further therapy was indicated.  ***     He confirms this.  He says he was getting echos every five years.  He had problems with his BP and had a stress test two years ago.  He also had an echo.  These apparently were both unremarkable except for a documented bicuspid aortic valve.  The patient denies any new symptoms such as chest discomfort, neck or arm discomfort. There has been no new shortness of breath, PND or orthopnea. There have been no reported palpitations, presyncope or syncope.  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     Past Medical History:  Diagnosis Date  . Bicuspid aortic valve   . COPD (chronic obstructive pulmonary disease) (HCC)   . Diarrhea    hx  . GERD (gastroesophageal reflux disease)   . History of kidney stones   . History of stomach ulcers   . Hypertension    no meds  . PONV (postoperative nausea and vomiting)   . Post-traumatic osteoarthritis of right knee 04/07/2016  . Pulmonary nodules   . Wart    "inside my nose" (04/20/2016)  . WPW (Wolff-Parkinson-White syndrome)     Past Surgical History:  Procedure Laterality Date  . ANTERIOR CERVICAL DECOMP/DISCECTOMY FUSION  ~ 2011  . APPENDECTOMY    . BACK SURGERY    . CARDIAC ELECTROPHYSIOLOGY MAPPING AND ABLATION  1990s   "for WPW"  . CERVICAL SPINE SURGERY     "trimmed discs"  . CYSTOSCOPY WITH  URETEROSCOPY, STONE BASKETRY AND STENT PLACEMENT    . JOINT REPLACEMENT    . KNEE ARTHROSCOPY Right 09/2014; 08/2015  . LAPAROSCOPIC CHOLECYSTECTOMY    . PARTIAL KNEE ARTHROPLASTY Right 04/20/2016   Procedure: KNEE ARTHROPLASY WITH  MENISCECTOMY;  Surgeon: Salvatore Marvel, MD;  Location: [facility] OR;  Service: Orthopedics;  Laterality: Right;  . TOTAL KNEE ARTHROPLASTY Right 04/20/2016  . TOTAL KNEE REVISION Right 04/20/2016   Procedure: TOTAL KNEE;  Surgeon: Salvatore Marvel, MD;  Location: [facility] OR;  Service: Orthopedics;  Laterality: Right;     Current Outpatient Prescriptions  Medication Sig Dispense Refill  . acetaminophen (TYLENOL) 325 MG tablet Take 2 tablets (650 mg total) by mouth every 6 (six) hours as needed for mild pain (or Fever >/= 101).    Marland Kitchen apixaban (ELIQUIS) 2.5 MG TABS tablet 1 tablet twice a day to prevent blood clots.  When finished with this prescription start Aspirin 81 mg a day 60 tablet 0  . docusate sodium (COLACE) 100 MG capsule 1 tab 2 times a day while on narcotics.  STOOL SOFTENER 60 capsule 0  . HYDROmorphone (DILAUDID) 2 MG tablet 1-2 tablets every 4-6 hrs as needed for pain 80 tablet 0  . losartan-hydrochlorothiazide (HYZAAR) 50-12.5 MG tablet Take 1 tablet by mouth  daily.    . metoprolol succinate (TOPROL-XL) 50 MG 24 hr tablet Take 1 tablet (50 mg total) by mouth daily. Take with or immediately following a meal. 50 tablet 0  . Multiple Vitamins-Minerals (MULTIVITAMIN PO) Take 1 tablet by mouth daily.    . mupirocin ointment (BACTROBAN) 2 % Place 1 application into the nose 2 (two) times daily.  0  . omeprazole (PRILOSEC) 40 MG capsule Take 40 mg by mouth daily.  2  . polyethylene glycol (MIRALAX / GLYCOLAX) packet 17grams in 6 oz of water twice a day until bowel movement.  LAXITIVE.  Restart if two days since last bowel movement 14 each 0  . potassium chloride (K-DUR,KLOR-CON) 10 MEQ tablet Take 10 mEq by mouth 2 (two) times daily.     No current facility-administered  medications for this visit.     Allergies:   Codeine and Penicillins    ROS:  Please see the history of present illness.   Otherwise, review of systems are positive for none.   All other systems are reviewed and negative.    PHYSICAL EXAM: VS:  There were no vitals taken for this visit. , BMI There is no height or weight on file to calculate BMI. GENERAL:  Well appearing NECK:  No jugular venous distention, waveform within normal limits, carotid upstroke brisk and symmetric, no bruits, no thyromegaly LUNGS:  Clear to auscultation bilaterally BACK:  No CVA tenderness CHEST:  Unremarkable HEART:  PMI not displaced or sustained,S1 and S2 within normal limits, no S3, no S4, no clicks, no rubs, soft apical early peaking nonradiating systolic murmur, no diastolic murmurs ABD:  Flat, positive bowel sounds normal in frequency in pitch, no bruits, no rebound, no guarding, no midline pulsatile mass, no hepatomegaly, no splenomegaly EXT:  2 plus pulses throughout, no edema, no cyanosis no clubbing    EKG:  EKG is *** ordered today. The ekg ordered today demonstrates sinus tachycardia, rate ***, axis within normal limits, intervals within normal limits, low voltage borderline on the chest leads, poor anterior R wave progression   Recent Labs: 04/22/2016: BUN 16; Creatinine, Ser 0.98; Hemoglobin 12.8; Platelets 224; Potassium 4.7; Sodium 135    Lipid Panel No results found for: CHOL, TRIG, HDL, CHOLHDL, VLDL, LDLCALC, LDLDIRECT    Wt Readings from Last 3 Encounters:  04/20/16 260 lb (117.9 kg)  04/13/16 260 lb (117.9 kg)  04/07/16 258 lb 5 oz (117.2 kg)      Other studies Reviewed: Additional studies/ records that were reviewed today include: ***. Review of the above records demonstrates:  ***   ASSESSMENT AND PLAN:   TACHYCARDIA:  ***   WPW:  He has had no further palpitations.  No further therapy is indicated.    BICUSPID AORTIC VALVE:     This will be followed clinically.   ***  OBESITY:   We had a long discussion about this with therapeutic lifestyle changes.  Current medicines are reviewed at length with the patient today.  The patient does not have concerns regarding medicines.  The following changes have been made:  N***  Labs/ tests ordered today include:  ***  No orders of the defined types were placed in this encounter.    Disposition:   FU with me in two years with an echo.  ***   Signed, Rollene RotundaJames Kasper Mudrick, MD  05/10/2016 2:34 PM    [facility]

## 2016-05-11 ENCOUNTER — Ambulatory Visit: Payer: BLUE CROSS/BLUE SHIELD | Admitting: Cardiology

## 2016-05-11 ENCOUNTER — Encounter: Payer: Self-pay | Admitting: *Deleted

## 2016-05-22 ENCOUNTER — Encounter: Payer: Self-pay | Admitting: Cardiology

## 2016-05-28 NOTE — Progress Notes (Signed)
Cardiology Office Note   Date:  05/30/2016   ID:  KATHERINE SYME, DOB Mar 24, 1960, MRN [id number]  PCP:  [name], MD  Cardiologist:   Rollene Rotunda, MD  Referring:  Dr. Thurston Hole  Chief Complaint  Patient presents with  . Dizziness      History of Present Illness: Adam Watson is a 56 y.o. male who I saw recently for preop clearance prior to knee surgery.   A progress note from [facility] dated 12/22/10 documents pt has aortic stenosis with bicuspid valve and WPW history with ablation at [facility] in 1990's.   I did send him for an echo and he had no stenosis with a bicuspid aortic valve.  Post op he did have tachycardia.  He ruled out for a PE.  Postop he's had a slow recovery. He's having post op pain and leg swelling.  The patient denies any new symptoms such as chest discomfort, neck or arm discomfort. There has been no new shortness of breath, PND or orthopnea. There have been no reported palpitations, presyncope or syncope.  However, he has had many other complaints such as dry mouth, daytime somnolence, insomnia at night.  He had two episodes of presyncope since he got out of the hospital.  On one of them he was added ask for pain and started feeling some lightheadedness and had to sit down. His daughter who is a nurse said she was having a hard time getting his blood pressure. He thinks one other episode similar. He wonders if it could be related to beta blockers as he was on this post hospital but was not on this prior to that time. He's not had any rapid heart rates. He's not had any chest pressure, neck or arm discomfort.   Past Medical History:  Diagnosis Date  . Bicuspid aortic valve   . COPD (chronic obstructive pulmonary disease) (HCC)   . Diarrhea    hx  . GERD (gastroesophageal reflux disease)   . History of kidney stones   . History of stomach ulcers   . Hypertension    no meds  . PONV (postoperative nausea and vomiting)   . Post-traumatic osteoarthritis of right knee  04/07/2016  . Pulmonary nodules   . Wart    "inside my nose" (04/20/2016)  . WPW (Wolff-Parkinson-White syndrome)     Past Surgical History:  Procedure Laterality Date  . ANTERIOR CERVICAL DECOMP/DISCECTOMY FUSION  ~ 2011  . APPENDECTOMY    . BACK SURGERY    . CARDIAC ELECTROPHYSIOLOGY MAPPING AND ABLATION  1990s   "for WPW"  . CERVICAL SPINE SURGERY     "trimmed discs"  . CYSTOSCOPY WITH URETEROSCOPY, STONE BASKETRY AND STENT PLACEMENT    . JOINT REPLACEMENT    . KNEE ARTHROSCOPY Right 09/2014; 08/2015  . LAPAROSCOPIC CHOLECYSTECTOMY    . PARTIAL KNEE ARTHROPLASTY Right 04/20/2016   Procedure: KNEE ARTHROPLASY WITH  MENISCECTOMY;  Surgeon: Salvatore Marvel, MD;  Location: [facility] OR;  Service: Orthopedics;  Laterality: Right;  . TOTAL KNEE ARTHROPLASTY Right 04/20/2016  . TOTAL KNEE REVISION Right 04/20/2016   Procedure: TOTAL KNEE;  Surgeon: Salvatore Marvel, MD;  Location: [facility] OR;  Service: Orthopedics;  Laterality: Right;     Current Outpatient Prescriptions  Medication Sig Dispense Refill  . acetaminophen (TYLENOL) 325 MG tablet Take 2 tablets (650 mg total) by mouth every 6 (six) hours as needed for mild pain (or Fever >/= 101).    Marland Kitchen aspirin 81 MG chewable tablet Chew  81 mg by mouth daily.    . celecoxib (CELEBREX) 200 MG capsule Take 200 mg by mouth daily.    Marland Kitchen. HYDROcodone-acetaminophen (NORCO/VICODIN) 5-325 MG tablet Take 1 tablet by mouth daily.    Marland Kitchen. losartan-hydrochlorothiazide (HYZAAR) 50-12.5 MG tablet Take 0.5 tablets by mouth daily.     . Multiple Vitamins-Minerals (MULTIVITAMIN PO) Take 1 tablet by mouth daily.    Marland Kitchen. omeprazole (PRILOSEC) 40 MG capsule Take 40 mg by mouth daily.  2   No current facility-administered medications for this visit.     Allergies:   Codeine and Penicillins    ROS:  Please see the history of present illness.   Otherwise, review of systems are positive for none.   All other systems are reviewed and negative.    PHYSICAL EXAM: VS:  BP (!) 148/92    Pulse 76   Ht 6\' 1"  (1.854 m)   Wt 250 lb (113.4 kg)   BMI 32.98 kg/m  , BMI Body mass index is 32.98 kg/m. GENERAL:  Well appearing NECK:  No jugular venous distention, waveform within normal limits, carotid upstroke brisk and symmetric, no bruits, no thyromegaly LUNGS:  Clear to auscultation bilaterally CHEST:  Unremarkable HEART:  PMI not displaced or sustained,S1 and S2 within normal limits, no S3, no S4, no clicks, no rubs, soft apical early peaking nonradiating systolic murmur, no diastolic murmurs ABD:  Flat, positive bowel sounds normal in frequency in pitch, no bruits, no rebound, no guarding, no midline pulsatile mass, no hepatomegaly, no splenomegaly EXT:  2 plus pulses throughout, no edema, no cyanosis no clubbing   EKG:  EKG is not ordered today.   Recent Labs: 04/22/2016: BUN 16; Creatinine, Ser 0.98; Hemoglobin 12.8; Platelets 224; Potassium 4.7; Sodium 135    Lipid Panel No results found for: CHOL, TRIG, HDL, CHOLHDL, VLDL, LDLCALC, LDLDIRECT    Wt Readings from Last 3 Encounters:  05/29/16 250 lb (113.4 kg)  04/20/16 260 lb (117.9 kg)  04/13/16 260 lb (117.9 kg)      Other studies Reviewed: Additional studies/ records that were reviewed today include: None Review of the above records demonstrates:     ASSESSMENT AND PLAN:   WPW:  He has had no further palpitations.  No further therapy is indicated.    BICUSPID AORTIC VALVE:     He had no stenosis recently.  No further work up is indicated.    PRESYNCOPE:  Given these episodes of lightheadedness and other symptoms I'm going to stop his beta blocker and restarted losartan probably half the previous dose. This also includes hydrochlorothiazide. We discussed the possibility of some of his symptoms being related to his pain medications. However, he's only taking these once daily.  OBESITY:   We had a long discussion about this with therapeutic lifestyle changes.  Current medicines are reviewed at length  with the patient today.  The patient does not have concerns regarding medicines.  The following changes have been made:  no change  Labs/ tests ordered today include:   No orders of the defined types were placed in this encounter.    Disposition:   FU with me in about six months.     Signed, Rollene RotundaJames Lorilei Horan, MD  05/30/2016 1:37 PM    [facility]

## 2016-05-29 ENCOUNTER — Ambulatory Visit (INDEPENDENT_AMBULATORY_CARE_PROVIDER_SITE_OTHER): Payer: BLUE CROSS/BLUE SHIELD | Admitting: Cardiology

## 2016-05-29 ENCOUNTER — Encounter: Payer: Self-pay | Admitting: Cardiology

## 2016-05-29 VITALS — BP 148/92 | HR 76 | Ht 73.0 in | Wt 250.0 lb

## 2016-05-29 DIAGNOSIS — I456 Pre-excitation syndrome: Secondary | ICD-10-CM

## 2016-05-29 DIAGNOSIS — R42 Dizziness and giddiness: Secondary | ICD-10-CM | POA: Diagnosis not present

## 2016-05-29 DIAGNOSIS — I10 Essential (primary) hypertension: Secondary | ICD-10-CM

## 2016-05-29 MED ORDER — METOPROLOL SUCCINATE ER 50 MG PO TB24: 50.0000 mg | ORAL_TABLET | Freq: Every day | ORAL | 3 refills | Status: DC

## 2016-05-29 NOTE — Patient Instructions (Signed)
Medication Instructions:  STOP- Metoprolol RESTART- Losartan/HCTZ 50/12.5 mg take 1/2 tablets daily  Labwork: None Ordered  Testing/Procedures: None Ordered  Follow-Up: Your physician recommends that you schedule a follow-up appointment in: 6 Weeks   Any Other Special Instructions Will Be Listed Below (If Applicable).   If you need a refill on your cardiac medications before your next appointment, please call your pharmacy.

## 2016-05-30 ENCOUNTER — Encounter: Payer: Self-pay | Admitting: Cardiology

## 2016-07-08 NOTE — Progress Notes (Signed)
Cardiology Office Note   Date:  07/10/2016   ID:  Adam Watson, DOB 10-Aug-1960, MRN [id number]  PCP:  [name], MD  Cardiologist:   Rollene Rotunda, MD  Referring:  Dr. Thurston Hole  Chief Complaint  Patient presents with  . Hypertension      History of Present Illness: Adam Watson is a 56 y.o. male who I saw recently for preop clearance prior to knee surgery.   A progress note from [facility] dated 12/22/10 documents pt has aortic stenosis with bicuspid valve and WPW history with ablation at [facility] in 1990's.   I did send him for an echo and he had no stenosis with a bicuspid aortic valve.  At the last visit he was having some lightheadedness.  I stopped his beta blocker.  I did restart a low dose of ARB.   He has done OK with with except that his BP is still in the high 140s over 90s.  He has had lots of trouble with his knee replacement and still cannot be very active.  He has been on steroids for this and has had some weight gain.   He is doing PT.  He is going to get a gym membership.  He does have a fast resting heart rate but no presyncope or syncope.  The patient denies any new symptoms such as chest discomfort, neck or arm discomfort. There has been no new shortness of breath, PND or orthopnea.    Past Medical History:  Diagnosis Date  . Bicuspid aortic valve   . COPD (chronic obstructive pulmonary disease) (HCC)   . Diarrhea    hx  . GERD (gastroesophageal reflux disease)   . History of kidney stones   . History of stomach ulcers   . Hypertension    no meds  . PONV (postoperative nausea and vomiting)   . Post-traumatic osteoarthritis of right knee 04/07/2016  . Pulmonary nodules   . Wart    "inside my nose" (04/20/2016)  . WPW (Wolff-Parkinson-White syndrome)     Past Surgical History:  Procedure Laterality Date  . ANTERIOR CERVICAL DECOMP/DISCECTOMY FUSION  ~ 2011  . APPENDECTOMY    . BACK SURGERY    . CARDIAC ELECTROPHYSIOLOGY MAPPING AND ABLATION  1990s   "for  WPW"  . CERVICAL SPINE SURGERY     "trimmed discs"  . CYSTOSCOPY WITH URETEROSCOPY, STONE BASKETRY AND STENT PLACEMENT    . JOINT REPLACEMENT    . KNEE ARTHROSCOPY Right 09/2014; 08/2015  . LAPAROSCOPIC CHOLECYSTECTOMY    . PARTIAL KNEE ARTHROPLASTY Right 04/20/2016   Procedure: KNEE ARTHROPLASY WITH  MENISCECTOMY;  Surgeon: Salvatore Marvel, MD;  Location: [facility] OR;  Service: Orthopedics;  Laterality: Right;  . TOTAL KNEE ARTHROPLASTY Right 04/20/2016  . TOTAL KNEE REVISION Right 04/20/2016   Procedure: TOTAL KNEE;  Surgeon: Salvatore Marvel, MD;  Location: [facility] OR;  Service: Orthopedics;  Laterality: Right;     Current Outpatient Prescriptions  Medication Sig Dispense Refill  . acetaminophen (TYLENOL) 325 MG tablet Take 2 tablets (650 mg total) by mouth every 6 (six) hours as needed for mild pain (or Fever >/= 101).    Marland Kitchen aspirin 81 MG chewable tablet Chew 81 mg by mouth daily.    . celecoxib (CELEBREX) 200 MG capsule Take 200 mg by mouth daily.    Marland Kitchen losartan-hydrochlorothiazide (HYZAAR) 50-12.5 MG tablet Take 1 tablet by mouth daily. 90 tablet 3  . Multiple Vitamins-Minerals (MULTIVITAMIN PO) Take 1 tablet by mouth daily.    Marland Kitchen  omeprazole (PRILOSEC) 40 MG capsule Take 40 mg by mouth daily.  2  . potassium chloride (K-DUR) 10 MEQ tablet Take 10 mEq by mouth daily.  4  . predniSONE (DELTASONE) 10 MG tablet Take 5 mg by mouth daily.     No current facility-administered medications for this visit.     Allergies:   Codeine; Penicillins; and Tetracyclines & related    ROS:  Please see the history of present illness.   Otherwise, review of systems are positive for hematuria and leg edema.   All other systems are reviewed and negative.    PHYSICAL EXAM: VS:  BP (!) 144/100 (BP Location: Right Arm, Patient Position: Sitting, Cuff Size: Large)   Pulse 98   Ht  (1.854 m)   Wt 262 lb (118.8 kg)   BMI 34.57 kg/m  , BMI Body mass index is 34.57 kg/m. GENERAL:  Well appearing NECK:  No jugular  venous distention, waveform within normal limits, carotid upstroke brisk and symmetric, no bruits, no thyromegaly LUNGS:  Clear to auscultation bilaterally CHEST:  Unremarkable HEART:  PMI not displaced or sustained,S1 and S2 within normal limits, no S3, no S4, no clicks, no rubs, soft apical early peaking nonradiating systolic murmur, no diastolic murmurs ABD:  Flat, positive bowel sounds normal in frequency in pitch, no bruits, no rebound, no guarding, no midline pulsatile mass, no hepatomegaly, no splenomegaly EXT:  2 plus pulses throughout, no edema, no cyanosis no clubbing   EKG:  EKG is not  ordered today.   Recent Labs: 04/22/2016: BUN 16; Creatinine, Ser 0.98; Hemoglobin 12.8; Platelets 224; Potassium 4.7; Sodium 135    Lipid Panel No results found for: CHOL, TRIG, HDL, CHOLHDL, VLDL, LDLCALC, LDLDIRECT    Wt Readings from Last 3 Encounters:  07/09/16 262 lb (118.8 kg)  05/29/16 250 lb (113.4 kg)  04/20/16 260 lb (117.9 kg)      Other studies Reviewed: Additional studies/ records that were reviewed today include: None Review of the above records demonstrates:      ASSESSMENT AND PLAN:   WPW:  He has had no further palpitations.  No further therapy is indicated.    He has a sinus tach but no SVT  BICUSPID AORTIC VALVE:     He had no stenosis recently on echo.  No further work up is indicated.    PRESYNCOPE:  He is not bothered by this.  No change in therapy is planned   HTN:  The blood pressure is still elevated.  I will increase the losartan/hct to a full dose daily.    OBESITY:    We had a long discussion about this with therapeutic lifestyle changes.  Current medicines are reviewed at length with the patient today.  The patient does not have concerns regarding medicines.  The following changes have been made:  no change  Labs/ tests ordered today include:    Orders Placed This Encounter  Procedures  . Basic Metabolic Panel (BMET)     Disposition:   FU  with me in about one month.     Signed, Rollene Rotunda, MD  07/10/2016 11:03 PM    [facility]

## 2016-07-09 ENCOUNTER — Ambulatory Visit (INDEPENDENT_AMBULATORY_CARE_PROVIDER_SITE_OTHER): Payer: BLUE CROSS/BLUE SHIELD | Admitting: Cardiology

## 2016-07-09 ENCOUNTER — Encounter: Payer: Self-pay | Admitting: Cardiology

## 2016-07-09 VITALS — BP 144/100 | HR 98 | Ht 73.0 in | Wt 262.0 lb

## 2016-07-09 DIAGNOSIS — Z79899 Other long term (current) drug therapy: Secondary | ICD-10-CM | POA: Diagnosis not present

## 2016-07-09 DIAGNOSIS — I10 Essential (primary) hypertension: Secondary | ICD-10-CM

## 2016-07-09 MED ORDER — LOSARTAN POTASSIUM-HCTZ 50-12.5 MG PO TABS: 1.0000 | ORAL_TABLET | Freq: Every day | ORAL | 3 refills | Status: DC

## 2016-07-09 NOTE — Patient Instructions (Addendum)
Medication Instructions:  INCREASE- Losartan/HCTZ 1 tablets daily  Labwork: BMP in 2 weeks  Testing/Procedures: None Ordered  Follow-Up: Your physician recommends that you schedule a follow-up appointment in: 1 Month   Any Other Special Instructions Will Be Listed Below (If Applicable).   If you need a refill on your cardiac medications before your next appointment, please call your pharmacy.

## 2016-07-10 ENCOUNTER — Encounter: Payer: Self-pay | Admitting: Cardiology

## 2016-07-21 ENCOUNTER — Encounter: Payer: Self-pay | Admitting: Cardiology

## 2016-08-06 NOTE — Progress Notes (Signed)
Cardiology Office Note   Date:  08/07/2016   ID:  [name], DOB [birth year], MRN [id number]  PCP:  [name], MD  Cardiologist:   Rollene Rotunda, MD  Referring:  Dr. Thurston Hole  Chief Complaint  Patient presents with  . Hypertension      History of Present Illness: Adam Watson is a 56 y.o. male who I saw recently for preop clearance prior to knee surgery.   A progress note from [facility] dated 12/22/10 documents pt has aortic stenosis with bicuspid valve and WPW history with ablation at [facility] in 1990's.   I did send him for an echo and he had no stenosis with a bicuspid aortic valve.  At a previous visit he was having some lightheadedness.  I stopped his beta blocker.  I did restart a low dose of ARB.  However, he still had some mild HTN.  He also has a rapid resting heart rate.  I increased his losartan/hct at the last visit. He has continued to have some increased BPs.  His knee is feeling better but he is still not able to be very active.  He is doing some PT.  His heart rate today is still fast but not significantly.     Past Medical History:  Diagnosis Date  . Bicuspid aortic valve   . COPD (chronic obstructive pulmonary disease) (HCC)   . Diarrhea    hx  . GERD (gastroesophageal reflux disease)   . History of kidney stones   . History of stomach ulcers   . Hypertension    no meds  . PONV (postoperative nausea and vomiting)   . Post-traumatic osteoarthritis of right knee 04/07/2016  . Pulmonary nodules   . Wart    "inside my nose" (04/20/2016)  . WPW (Wolff-Parkinson-White syndrome)     Past Surgical History:  Procedure Laterality Date  . ANTERIOR CERVICAL DECOMP/DISCECTOMY FUSION  ~ 2011  . APPENDECTOMY    . BACK SURGERY    . CARDIAC ELECTROPHYSIOLOGY MAPPING AND ABLATION  1990s   "for WPW"  . CERVICAL SPINE SURGERY     "trimmed discs"  . CYSTOSCOPY WITH URETEROSCOPY, STONE BASKETRY AND STENT PLACEMENT    . JOINT REPLACEMENT    . KNEE ARTHROSCOPY Right  09/2014; 08/2015  . LAPAROSCOPIC CHOLECYSTECTOMY    . PARTIAL KNEE ARTHROPLASTY Right 04/20/2016   Procedure: KNEE ARTHROPLASY WITH  MENISCECTOMY;  Surgeon: Salvatore Marvel, MD;  Location: [facility] OR;  Service: Orthopedics;  Laterality: Right;  . TOTAL KNEE ARTHROPLASTY Right 04/20/2016  . TOTAL KNEE REVISION Right 04/20/2016   Procedure: TOTAL KNEE;  Surgeon: Salvatore Marvel, MD;  Location: [facility] OR;  Service: Orthopedics;  Laterality: Right;     Current Outpatient Prescriptions  Medication Sig Dispense Refill  . acetaminophen (TYLENOL) 325 MG tablet Take 2 tablets (650 mg total) by mouth every 6 (six) hours as needed for mild pain (or Fever >/= 101).    Marland Kitchen aspirin 81 MG chewable tablet Chew 81 mg by mouth daily.    . celecoxib (CELEBREX) 200 MG capsule Take 200 mg by mouth daily.    Marland Kitchen losartan-hydrochlorothiazide (HYZAAR) 50-12.5 MG tablet Take 1 tablet by mouth daily. 90 tablet 3  . Multiple Vitamins-Minerals (MULTIVITAMIN PO) Take 1 tablet by mouth daily.    Marland Kitchen omeprazole (PRILOSEC) 40 MG capsule Take 40 mg by mouth daily.  2  . potassium chloride (K-DUR) 10 MEQ tablet Take 10 mEq by mouth daily.  4  . amLODipine (NORVASC)  2.5 MG tablet Take 1 tablet (2.5 mg total) by mouth daily. 90 tablet 3   No current facility-administered medications for this visit.     Allergies:   Codeine; Penicillins; and Tetracyclines & related    ROS:  Please see the history of present illness.   Otherwise, review of systems are positive for none.   All other systems are reviewed and negative.    PHYSICAL EXAM: VS:  BP (!) 150/98   Pulse 94   Ht 6\' 1"  (1.854 m)   Wt 264 lb 3.2 oz (119.8 kg)   BMI 34.86 kg/m  , BMI Body mass index is 34.86 kg/m.  GENERAL:  Well appearing NECK:  No jugular venous distention, waveform within normal limits, carotid upstroke brisk and symmetric, no bruits, no thyromegaly LUNGS:  Clear to auscultation bilaterally CHEST:  Unremarkable HEART:  PMI not displaced or sustained,S1 and S2  within normal limits, no S3, no S4, no clicks, no rubs, no murmurs ABD:  Flat, positive bowel sounds normal in frequency in pitch, no bruits, no rebound, no guarding, no midline pulsatile mass, no hepatomegaly, no splenomegaly EXT:  2 plus pulses throughout, right greater than left leg edema, no cyanosis no clubbing   EKG:  EKG is noteordered today.   Recent Labs: 04/22/2016: BUN 16; Creatinine, Ser 0.98; Hemoglobin 12.8; Platelets 224; Potassium 4.7; Sodium 135    Lipid Panel No results found for: CHOL, TRIG, HDL, CHOLHDL, VLDL, LDLCALC, LDLDIRECT    Wt Readings from Last 3 Encounters:  08/07/16 264 lb 3.2 oz (119.8 kg)  07/09/16 262 lb (118.8 kg)  05/29/16 250 lb (113.4 kg)      Other studies Reviewed: Additional studies/ records that were reviewed today include: None Review of the above records demonstrates:      ASSESSMENT AND PLAN:   WPW:    He has had no further palpitations.  No further therapy is indicated.     BICUSPID AORTIC VALVE:    No stenosis on recent echo.   HTN:  I will add Norvasc 2.5 mg daily.  He will call with with BP readings.  Further adjustments will be over the phone for now.   OBESITY:   The patient understands the need to lose weight with diet and exercise. We have discussed specific strategies for this.  Current medicines are reviewed at length with the patient today.  The patient does not have concerns regarding medicines.  The following changes have been made:  As above.  Labs/ tests ordered today include:  None  No orders of the defined types were placed in this encounter.    Disposition:   FU with me in 2 months.    Signed, Rollene RotundaJames Terrill Alperin, MD  08/07/2016 9:11 AM    [facility]

## 2016-08-07 ENCOUNTER — Encounter: Payer: Self-pay | Admitting: Cardiology

## 2016-08-07 ENCOUNTER — Ambulatory Visit (INDEPENDENT_AMBULATORY_CARE_PROVIDER_SITE_OTHER): Payer: BLUE CROSS/BLUE SHIELD | Admitting: Cardiology

## 2016-08-07 VITALS — BP 150/98 | HR 94 | Ht 73.0 in | Wt 264.2 lb

## 2016-08-07 DIAGNOSIS — Q231 Congenital insufficiency of aortic valve: Secondary | ICD-10-CM | POA: Diagnosis not present

## 2016-08-07 DIAGNOSIS — I10 Essential (primary) hypertension: Secondary | ICD-10-CM

## 2016-08-07 MED ORDER — AMLODIPINE BESYLATE 2.5 MG PO TABS: 2.5000 mg | ORAL_TABLET | Freq: Every day | ORAL | 3 refills | Status: DC

## 2016-08-07 NOTE — Patient Instructions (Signed)
Medication Instructions:  START- Amlodipine 2.5 mg daily  Labwork: None Ordered  Testing/Procedures: None Ordered  Follow-Up: Your physician recommends that you schedule a follow-up appointment in: 2 Months.   Any Other Special Instructions Will Be Listed Below (If Applicable).   If you need a refill on your cardiac medications before your next appointment, please call your pharmacy.

## 2016-10-01 NOTE — Progress Notes (Signed)
Cardiology Office Note   Date:  10/04/2016   ID:  Adam Watson, DOB 04-01-1960, MRN [id number]  PCP:  [name], MD  Cardiologist:   Rollene Rotunda, MD  Referring:  Dr. Thurston Hole  Chief Complaint  Patient presents with  . Hypertension      History of Present Illness: Adam Watson is a 56 y.o. male who I saw for follow up of aortic stenosis with bicuspid valve and WPW history with ablation at [facility] in 1990's.   I did send him for an echo and he had no stenosis with a bicuspid aortic valve.  At a previous visit he was having some lightheadedness.  I stopped his beta blocker.  I did restart a low dose of ARB.  However, he still had some mild HTN.  He also has a rapid resting heart rate.  I increased his losartan/hct.  At the last visit I added Norvasc.  He actually feels much better.  His BP has been controlled.  His heart rate is better.  The patient denies any new symptoms such as chest discomfort, neck or arm discomfort. There has been no new shortness of breath, PND or orthopnea. There have been no reported palpitations, presyncope or syncope.  He still has some limitations with his activity.   Past Medical History:  Diagnosis Date  . Bicuspid aortic valve   . COPD (chronic obstructive pulmonary disease) (HCC)   . Diarrhea    hx  . GERD (gastroesophageal reflux disease)   . History of kidney stones   . History of stomach ulcers   . Hypertension    no meds  . PONV (postoperative nausea and vomiting)   . Post-traumatic osteoarthritis of right knee 04/07/2016  . Pulmonary nodules   . Wart    "inside my nose" (04/20/2016)  . WPW (Wolff-Parkinson-White syndrome)     Past Surgical History:  Procedure Laterality Date  . ANTERIOR CERVICAL DECOMP/DISCECTOMY FUSION  ~ 2011  . APPENDECTOMY    . BACK SURGERY    . CARDIAC ELECTROPHYSIOLOGY MAPPING AND ABLATION  1990s   "for WPW"  . CERVICAL SPINE SURGERY     "trimmed discs"  . CYSTOSCOPY WITH URETEROSCOPY, STONE BASKETRY AND  STENT PLACEMENT    . JOINT REPLACEMENT    . KNEE ARTHROSCOPY Right 09/2014; 08/2015  . LAPAROSCOPIC CHOLECYSTECTOMY    . PARTIAL KNEE ARTHROPLASTY Right 04/20/2016   Procedure: KNEE ARTHROPLASY WITH  MENISCECTOMY;  Surgeon: Salvatore Marvel, MD;  Location: [facility] OR;  Service: Orthopedics;  Laterality: Right;  . TOTAL KNEE ARTHROPLASTY Right 04/20/2016  . TOTAL KNEE REVISION Right 04/20/2016   Procedure: TOTAL KNEE;  Surgeon: Salvatore Marvel, MD;  Location: [facility] OR;  Service: Orthopedics;  Laterality: Right;     Current Outpatient Prescriptions  Medication Sig Dispense Refill  . acetaminophen (TYLENOL) 325 MG tablet Take 2 tablets (650 mg total) by mouth every 6 (six) hours as needed for mild pain (or Fever >/= 101).    Marland Kitchen amLODipine (NORVASC) 2.5 MG tablet Take 1 tablet (2.5 mg total) by mouth daily. 90 tablet 3  . aspirin 81 MG chewable tablet Chew 81 mg by mouth daily.    Marland Kitchen losartan-hydrochlorothiazide (HYZAAR) 50-12.5 MG tablet Take 1 tablet by mouth daily. 90 tablet 3  . Multiple Vitamins-Minerals (MULTIVITAMIN PO) Take 1 tablet by mouth daily.    Marland Kitchen omeprazole (PRILOSEC) 40 MG capsule Take 40 mg by mouth daily.  2  . potassium chloride (K-DUR) 10 MEQ tablet Take  10 mEq by mouth daily.  4  . predniSONE (DELTASONE) 5 MG tablet Take 5 mg by mouth daily with breakfast.     No current facility-administered medications for this visit.     Allergies:   Codeine; Penicillins; and Tetracyclines & related    ROS:  Please see the history of present illness.   Otherwise, review of systems are positive for none.   All other systems are reviewed and negative.    PHYSICAL EXAM: VS:  BP 128/90   Pulse 79   Ht 6\' 1"  (1.854 m)   Wt 261 lb 9.6 oz (118.7 kg)   BMI 34.51 kg/m  , BMI Body mass index is 34.51 kg/m.  GENERAL:  Well appearing NECK:  No jugular venous distention, waveform within normal limits, carotid upstroke brisk and symmetric, no bruits, no thyromegaly LUNGS:  Clear to auscultation  bilaterally BACK:  No CVA tenderness CHEST:  Unremarkable HEART:  PMI not displaced or sustained,S1 and S2 within normal limits, no S3, no S4, no clicks, no rubs, no murmurs ABD:  Flat, positive bowel sounds normal in frequency in pitch, no bruits, no rebound, no guarding, no midline pulsatile mass, no hepatomegaly, no splenomegaly EXT:  2 plus pulses throughout, no edema, no cyanosis no clubbing   EKG:  EKG is  ordered today. Sinus rate 79, axis within normal limits, intervals within normal limits, no acute ST-T wave changes.  Recent Labs: 04/22/2016: Hemoglobin 12.8; Platelets 224 10/02/2016: BUN 13; Creatinine, Ser 1.01; Potassium 4.2; Sodium 138    Lipid Panel No results found for: CHOL, TRIG, HDL, CHOLHDL, VLDL, LDLCALC, LDLDIRECT    Wt Readings from Last 3 Encounters:  10/02/16 261 lb 9.6 oz (118.7 kg)  08/07/16 264 lb 3.2 oz (119.8 kg)  07/09/16 262 lb (118.8 kg)      Other studies Reviewed: Additional studies/ records that were reviewed today include: None Review of the above records demonstrates:      ASSESSMENT AND PLAN:   WPW:    He has no palpitations.  No further work up is planned.    BICUSPID AORTIC VALVE:   He has had no stenosis.  I will likely image him again in July.   HTN: The blood pressure is at target. No change in medications is indicated. We will continue with therapeutic lifestyle changes (TLC).  OBESITY:  He understand the need to lose weight with diet and exercise.  Current medicines are reviewed at length with the patient today.  The patient does not have concerns regarding medicines.  The following changes have been made:  None  Labs/ tests ordered today include:    Orders Placed This Encounter  Procedures  . Basic Metabolic Panel (BMET)  . EKG 12-Lead     Disposition:   FU with me in 12  months.    Signed, Rollene RotundaJames Maricela Kawahara, MD  10/04/2016 5:56 PM    [facility]

## 2016-10-02 ENCOUNTER — Encounter: Payer: Self-pay | Admitting: Cardiology

## 2016-10-02 ENCOUNTER — Encounter (INDEPENDENT_AMBULATORY_CARE_PROVIDER_SITE_OTHER): Payer: Self-pay

## 2016-10-02 ENCOUNTER — Ambulatory Visit (INDEPENDENT_AMBULATORY_CARE_PROVIDER_SITE_OTHER): Payer: BLUE CROSS/BLUE SHIELD | Admitting: Cardiology

## 2016-10-02 VITALS — BP 128/90 | HR 79 | Ht 73.0 in | Wt 261.6 lb

## 2016-10-02 DIAGNOSIS — Q231 Congenital insufficiency of aortic valve: Secondary | ICD-10-CM

## 2016-10-02 DIAGNOSIS — I10 Essential (primary) hypertension: Secondary | ICD-10-CM

## 2016-10-02 DIAGNOSIS — Q2381 Bicuspid aortic valve: Secondary | ICD-10-CM

## 2016-10-02 LAB — BASIC METABOLIC PANEL
BUN/Creatinine Ratio: 13 (ref 9–20)
BUN: 13 mg/dL (ref 6–24)
CO2: 23 mmol/L (ref 20–29)
Calcium: 9.8 mg/dL (ref 8.7–10.2)
Chloride: 100 mmol/L (ref 96–106)
Creatinine, Ser: 1.01 mg/dL (ref 0.76–1.27)
GFR calc Af Amer: 97 mL/min/{1.73_m2} (ref >59)
GFR calc non Af Amer: 84 mL/min/{1.73_m2} (ref >59)
Glucose: 103 mg/dL — ABNORMAL HIGH (ref 65–99)
Potassium: 4.2 mmol/L (ref 3.5–5.2)
Sodium: 138 mmol/L (ref 134–144)

## 2016-10-02 NOTE — Patient Instructions (Signed)
Medication Instructions:  Continue current medications  Labwork: BMP  Testing/Procedures: None Ordered  Follow-Up: Your physician wants you to follow-up in: 1 Year. You will receive a reminder letter in the mail two months in advance. If you don't receive a letter, please call our office to schedule the follow-up appointment.   Any Other Special Instructions Will Be Listed Below (If Applicable).   If you need a refill on your cardiac medications before your next appointment, please call your pharmacy.

## 2016-10-04 ENCOUNTER — Encounter: Payer: Self-pay | Admitting: Cardiology

## 2016-10-04 DIAGNOSIS — Q231 Congenital insufficiency of aortic valve: Secondary | ICD-10-CM | POA: Insufficient documentation

## 2016-10-14 ENCOUNTER — Encounter: Payer: Self-pay | Admitting: *Deleted

## 2016-10-15 ENCOUNTER — Encounter: Payer: Self-pay | Admitting: *Deleted

## 2017-04-15 ENCOUNTER — Other Ambulatory Visit: Payer: Self-pay | Admitting: Cardiology

## 2017-04-15 NOTE — Telephone Encounter (Signed)
REFILL 

## 2017-07-20 ENCOUNTER — Other Ambulatory Visit: Payer: Self-pay | Admitting: Cardiology

## 2017-07-20 NOTE — Telephone Encounter (Signed)
Rx sent to pharmacy   

## 2017-10-21 ENCOUNTER — Other Ambulatory Visit: Payer: Self-pay | Admitting: Cardiology

## 2017-10-22 ENCOUNTER — Other Ambulatory Visit: Payer: Self-pay | Admitting: Cardiology

## 2017-11-03 NOTE — Progress Notes (Signed)
Cardiology Office Note   Date:  11/04/2017   ID:  Adam MoraleJohn R Greenwalt, DOB 05/30/1960, MRN [id number]  PCP:  Cheral BayHawks, Aldene N, MD  Cardiologist:   Rollene RotundaJames Pinchos Topel, MD  Referring:  Dr. Thurston HoleWainer  Chief Complaint  Patient presents with  . Bicuspid Aortic Valve      History of Present Illness: Adam Watson is a 57 y.o. male who I saw for follow up of aortic stenosis with bicuspid valve and WPW history with [facility].   I did send him for an echo and he had no stenosis with a bicuspid aortic valve.  At a previous visit he was having some lightheadedness.  I stopped his beta blocker.  I did restart a low dose of ARB.  However, he still had some mild HTN.  He also has a rapid resting heart rate.  I increased his losartan/hct.   I added Norvasc.  He returns for follow up.    Since I last saw him he has done well.  The patient denies any new symptoms such as chest discomfort, neck or arm discomfort. There has been no new shortness of breath, PND or orthopnea. There have been no reported palpitations, presyncope or syncope.  He walks routinely.     Past Medical History:  Diagnosis Date  . Bicuspid aortic valve   . COPD (chronic obstructive pulmonary disease) (HCC)   . Diarrhea    hx  . GERD (gastroesophageal reflux disease)   . History of kidney stones   . History of stomach ulcers   . Hypertension    no meds  . PONV (postoperative nausea and vomiting)   . Post-traumatic osteoarthritis of right knee 04/07/2016  . Pulmonary nodules   . Wart    "inside my nose" (04/20/2016)  . WPW (Wolff-Parkinson-White syndrome)     Past Surgical History:  Procedure Laterality Date  . ANTERIOR CERVICAL DECOMP/DISCECTOMY FUSION  ~ 2011  . APPENDECTOMY    . BACK SURGERY    . CARDIAC ELECTROPHYSIOLOGY MAPPING AND ABLATION  1990s   "for WPW"  . CERVICAL SPINE SURGERY     "trimmed discs"  . CYSTOSCOPY WITH URETEROSCOPY, STONE BASKETRY AND STENT PLACEMENT    . JOINT REPLACEMENT    . KNEE  ARTHROSCOPY Right 09/2014; 08/2015  . LAPAROSCOPIC CHOLECYSTECTOMY    . PARTIAL KNEE ARTHROPLASTY Right 04/20/2016   Procedure: KNEE ARTHROPLASY WITH  MENISCECTOMY;  Surgeon: Salvatore Marvelobert Wainer, MD;  [facility];  Service: Orthopedics;  Laterality: Right;  . TOTAL KNEE ARTHROPLASTY Right 04/20/2016  . TOTAL KNEE REVISION Right 04/20/2016   Procedure: TOTAL KNEE;  Surgeon: Salvatore Marvelobert Wainer, MD;  [facility];  Service: Orthopedics;  Laterality: Right;     Current Outpatient Medications  Medication Sig Dispense Refill  . amLODipine (NORVASC) 2.5 MG tablet Take 1 tablet (2.5 mg total) by mouth daily. 90 tablet 3  . aspirin 81 MG chewable tablet Chew 81 mg by mouth daily.    Marland Kitchen. losartan-hydrochlorothiazide (HYZAAR) 50-12.5 MG tablet Take 1 tablet by mouth daily. 90 tablet 3  . Multiple Vitamins-Minerals (MULTIVITAMIN PO) Take 1 tablet by mouth daily.    Marland Kitchen. omeprazole (PRILOSEC) 40 MG capsule Take 1 capsule (40 mg total) by mouth daily. 90 capsule 3  . sildenafil (VIAGRA) 50 MG tablet Take 1 tablet (50 mg total) by mouth daily as needed for erectile dysfunction. 10 tablet 0   No current facility-administered medications for this visit.     Allergies:  Codeine; Penicillins; and Tetracyclines & related    ROS:  Please see the history of present illness.   Otherwise, review of systems are positive for ED.   All other systems are reviewed and negative.    PHYSICAL EXAM: VS:  BP 126/86   Pulse 81   Ht 6\' 1"  (1.854 m)   Wt 265 lb 9.6 oz (120.5 kg)   BMI 35.04 kg/m  , BMI Body mass index is 35.04 kg/m.  GENERAL:  Well appearing NECK:  No jugular venous distention, waveform within normal limits, carotid upstroke brisk and symmetric, no bruits, no thyromegaly LUNGS:  Clear to auscultation bilaterally CHEST:  Unremarkable HEART:  PMI not displaced or sustained,S1 and S2 within normal limits, no S3, no S4, no clicks, no rubs, very soft apical early peaking systolic murmur, no diastolic  murmurs ABD:  Flat, positive bowel sounds normal in frequency in pitch, no bruits, no rebound, no guarding, no midline pulsatile mass, no hepatomegaly, no splenomegaly EXT:  2 plus pulses throughout, no edema, no cyanosis no clubbing   EKG:  EKG is  ordered today. Sinus rate 81, axis within normal limits, intervals within normal limits, no acute ST-T wave changes.  Recent Labs: No results found for requested labs within last 8760 hours.    Lipid Panel No results found for: CHOL, TRIG, HDL, CHOLHDL, VLDL, LDLCALC, LDLDIRECT    Wt Readings from Last 3 Encounters:  11/04/17 265 lb 9.6 oz (120.5 kg)  10/02/16 261 lb 9.6 oz (118.7 kg)  08/07/16 264 lb 3.2 oz (119.8 kg)      Other studies Reviewed: Additional studies/ records that were reviewed today include: No Review of the above records demonstrates:      ASSESSMENT AND PLAN:   WPW:     He has had no recurrent symptoms.  No change in therapy.    BICUSPID AORTIC VALVE:   He had no stenosis on echo.  I will follow this clinically.   HTN: The blood pressure is at target.  No change in therapy.   OBESITY:    We talked again about weight loss strategies.    ED:  I will give him one prescription of Viagra.    Current medicines are reviewed at length with the patient today.  The patient does not have concerns regarding medicines.  The following changes have been made:  None  Labs/ tests ordered today include:  None  Orders Placed This Encounter  Procedures  . EKG 12-Lead     Disposition:   FU with me in 12 months.    Signed, Rollene RotundaJames Shondell Poulson, MD  11/04/2017 12:51 PM    [facility]

## 2017-11-04 ENCOUNTER — Ambulatory Visit (INDEPENDENT_AMBULATORY_CARE_PROVIDER_SITE_OTHER): Payer: BLUE CROSS/BLUE SHIELD | Admitting: Cardiology

## 2017-11-04 ENCOUNTER — Encounter: Payer: Self-pay | Admitting: Cardiology

## 2017-11-04 VITALS — BP 126/86 | HR 81 | Ht 73.0 in | Wt 265.6 lb

## 2017-11-04 DIAGNOSIS — Q231 Congenital insufficiency of aortic valve: Secondary | ICD-10-CM

## 2017-11-04 DIAGNOSIS — I10 Essential (primary) hypertension: Secondary | ICD-10-CM | POA: Diagnosis not present

## 2017-11-04 MED ORDER — OMEPRAZOLE 40 MG PO CPDR: 40.0000 mg | DELAYED_RELEASE_CAPSULE | Freq: Every day | ORAL | 3 refills | Status: DC

## 2017-11-04 MED ORDER — AMLODIPINE BESYLATE 2.5 MG PO TABS: 2.5000 mg | ORAL_TABLET | Freq: Every day | ORAL | 3 refills | Status: DC

## 2017-11-04 MED ORDER — SILDENAFIL CITRATE 50 MG PO TABS: 50.0000 mg | ORAL_TABLET | Freq: Every day | ORAL | 0 refills | Status: DC | PRN

## 2017-11-04 MED ORDER — LOSARTAN POTASSIUM-HCTZ 50-12.5 MG PO TABS: 1.0000 | ORAL_TABLET | Freq: Every day | ORAL | 3 refills | Status: DC

## 2017-11-04 NOTE — Patient Instructions (Signed)

## 2018-01-25 IMAGING — CT CT ANGIO CHEST
2 of 6 series · 18 of 36 positions shown · IV contrast (Omni 300)
Comparison: Chest CT 08/20/2015, and 05/14/2014

CLINICAL DATA: Tachycardia.  Post knee surgery.

EXAM:
CT ANGIOGRAPHY CHEST WITH CONTRAST
TECHNIQUE: Multidetector CT imaging of the chest was performed using the
standard protocol during bolus administration of intravenous
contrast. Multiplanar CT image reconstructions and MIPs were
obtained to evaluate the vascular anatomy.
CONTRAST:  100 cc Isovue 370 IV

[Series 6: pe thins · axial · 0.73mm/px · z∈[+1211,+1411]mm · 17 of 226 slices shown]
[im 13/226  lung]
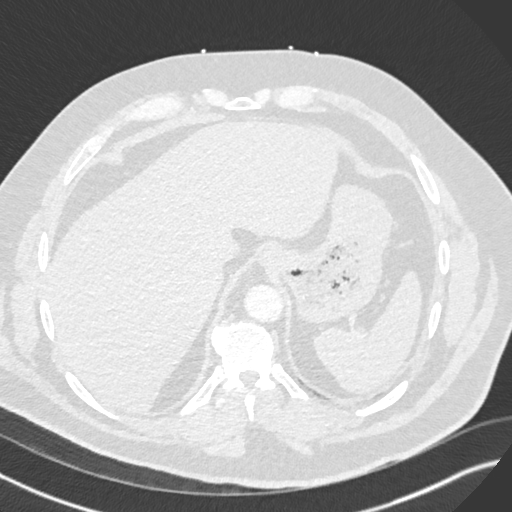
[im 26/226  mediastinal]
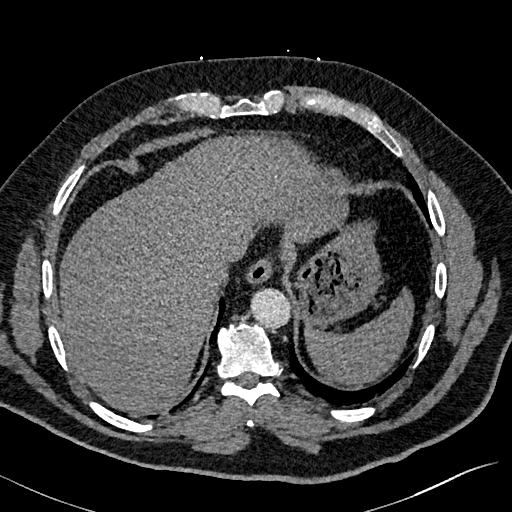
[im 38/226  lung]
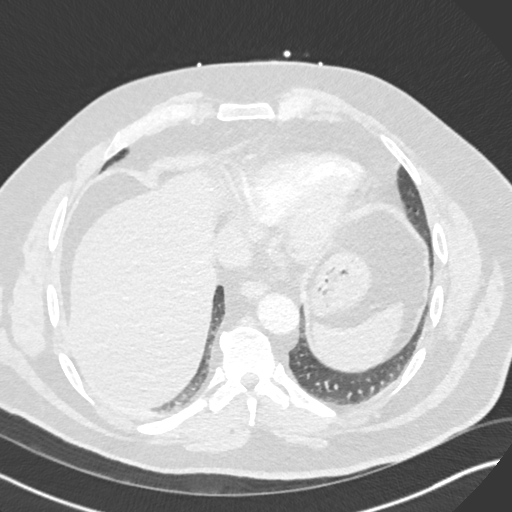
[im 51/226  mediastinal]
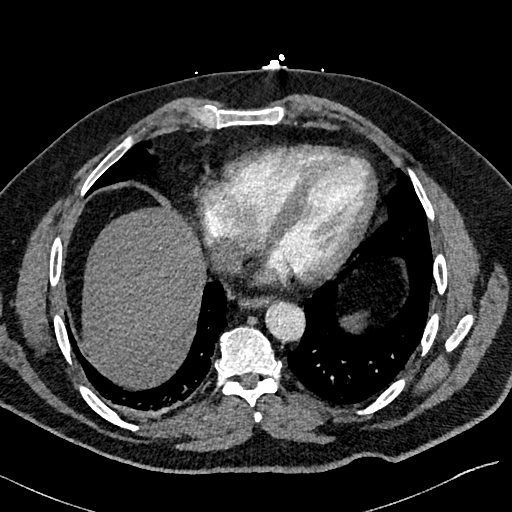
[im 63/226  lung]
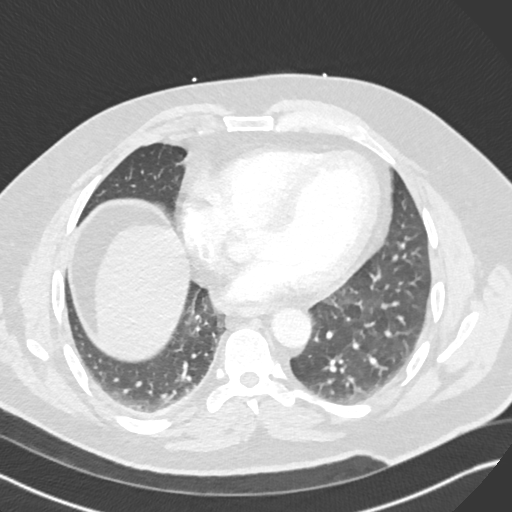
[im 76/226  mediastinal]
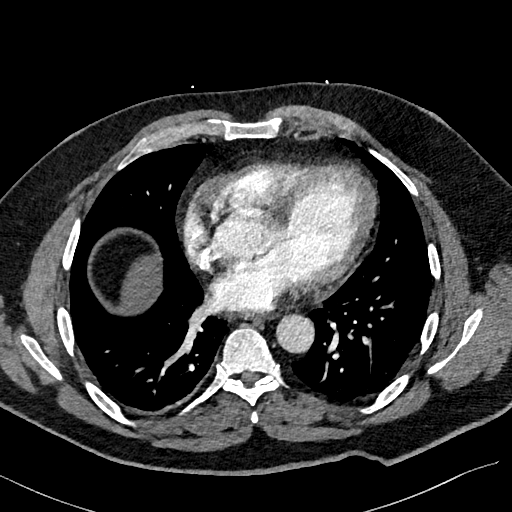
[im 88/226  lung]
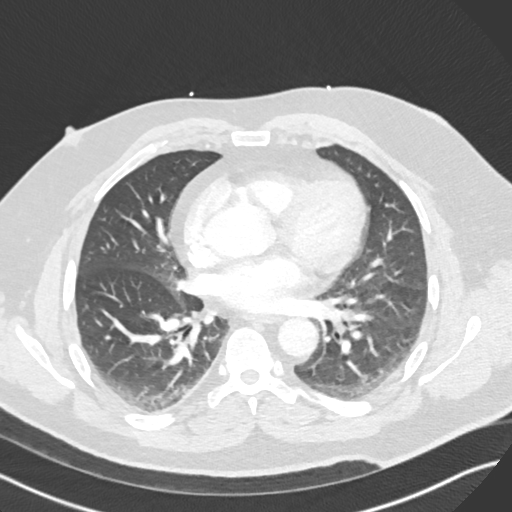
[im 101/226  mediastinal]
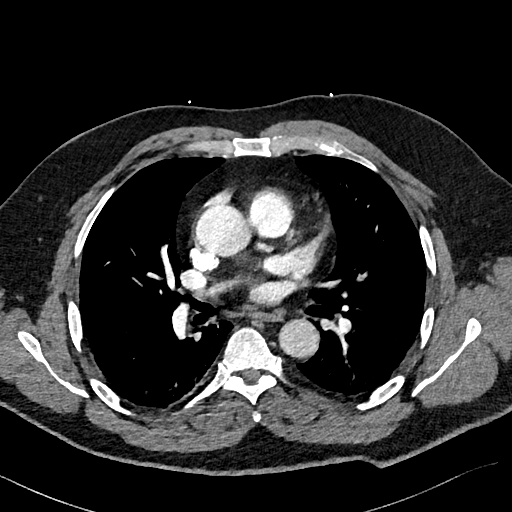
[im 113/226  lung]
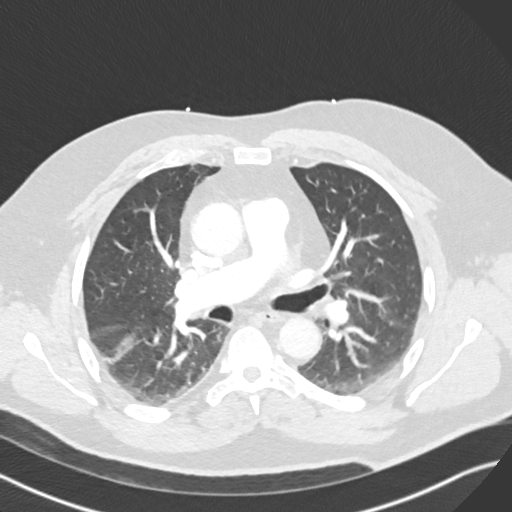
[im 126/226  mediastinal]
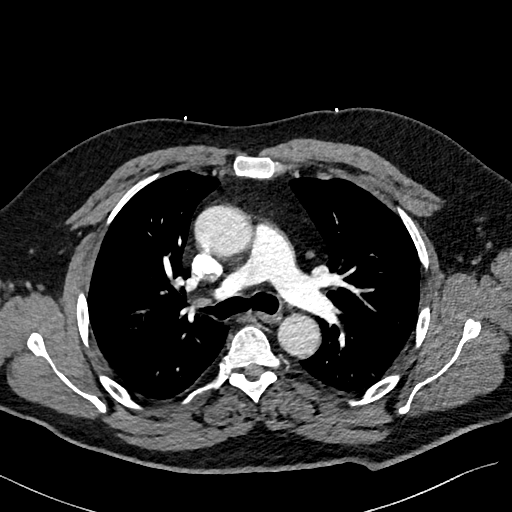
[im 138/226  lung]
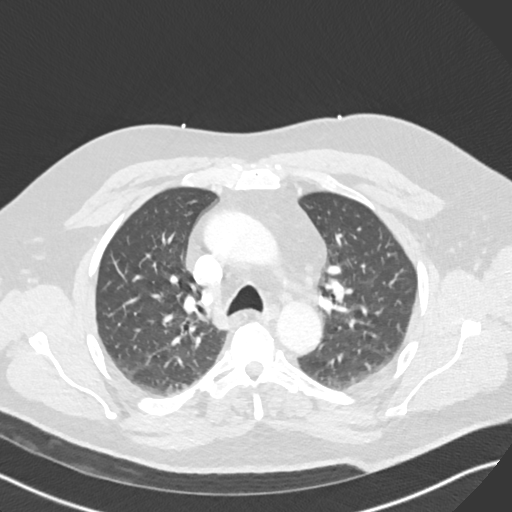
[im 151/226  mediastinal]
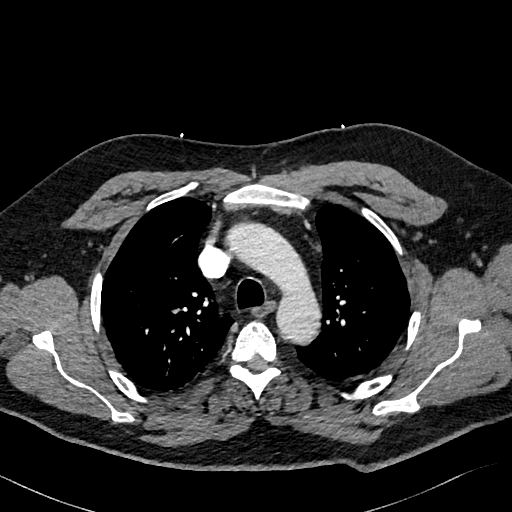
[im 163/226  lung]
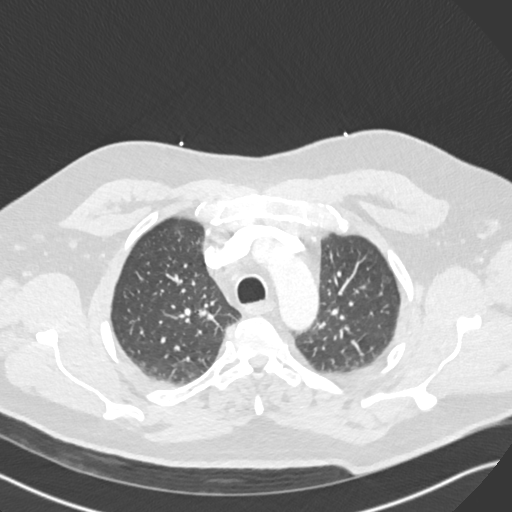
[im 176/226  mediastinal]
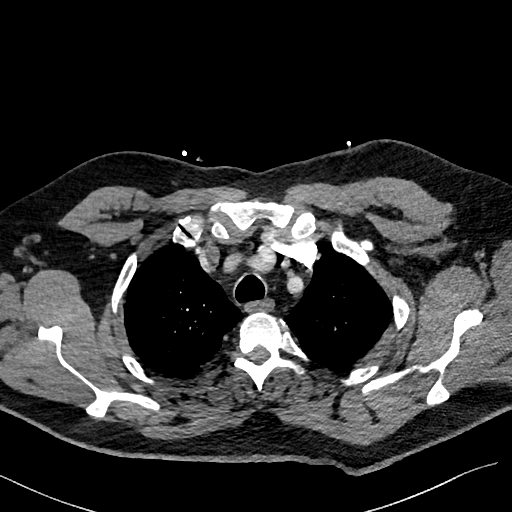
[im 188/226  lung]
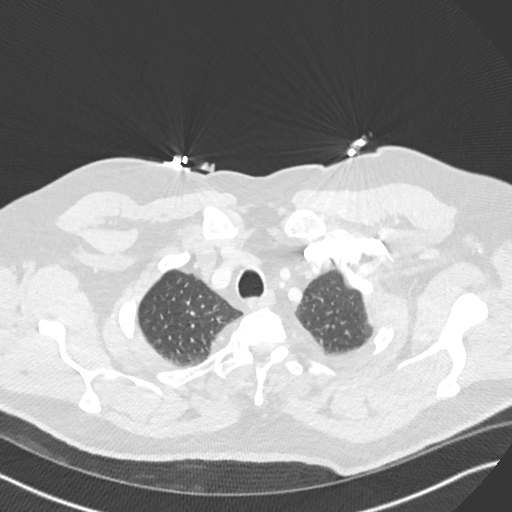
[im 201/226  mediastinal]
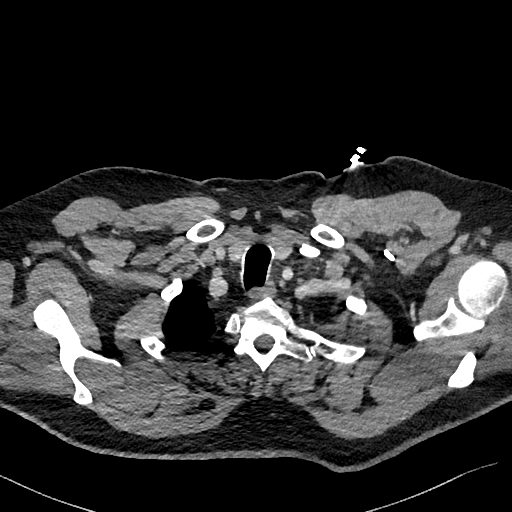
[im 213/226  lung]
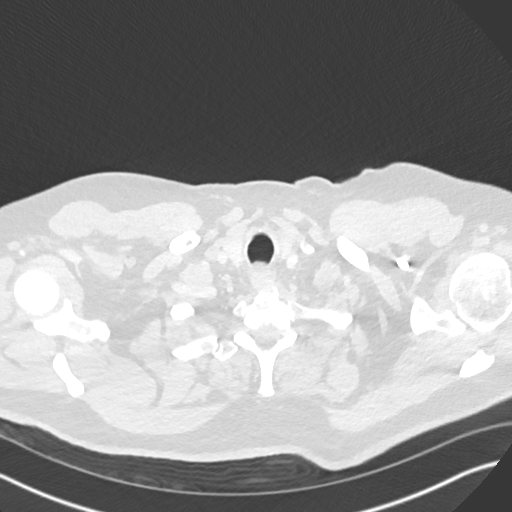

[Series 7: pe 2mm cor · coronal · 0.47mm/px · 1 of 140 slices shown]
[im 70/140  mediastinal]
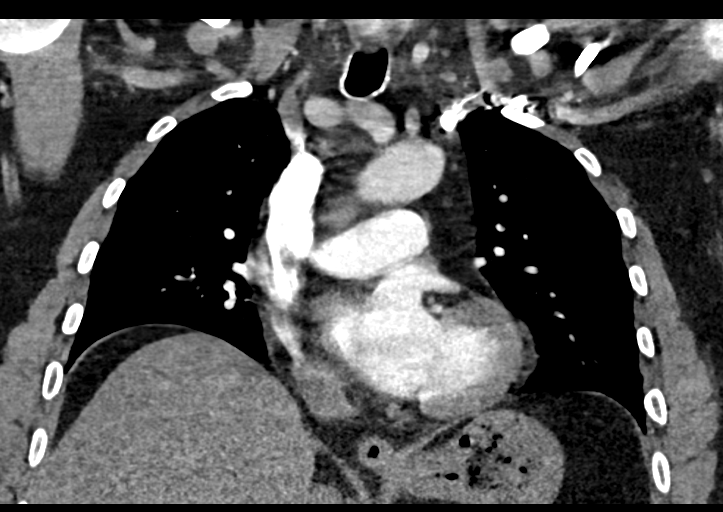

[18 of 36 positions shown; findings below may reference images not displayed]

FINDINGS: Cardiovascular: There are no filling defects within the pulmonary
arteries to the distal segmental level to suggest pulmonary embolus.
No evidence of aortic dissection. The heart is normal in size.

Mediastinum/Nodes: Small mediastinal nodes, not enlarged by size
criteria. No hilar adenopathy. No axillary adenopathy.

Lungs/Pleura: Scattered linear atelectasis throughout both lungs.
Mild dependent atelectasis in the right lower lobe. Right lower lobe
pulmonary nodule measures 10 x 5 mm, unchanged to slightly decreased
in size from prior CTs. Tiny subpleural nodule in the right upper
lobe image 62 series 5 is unchanged. A more central small pulmonary
nodule on prior CT in the right upper lobe is not currently
visualized. No evidence of pulmonary edema. No pleural fluid.
Trachea and mainstem bronchi are patent.

Upper Abdomen: No acute abnormality.

Musculoskeletal: There are no acute or suspicious osseous
abnormalities.

Review of the MIP images confirms the above findings.
IMPRESSION: 1. No pulmonary embolus.
2. Scattered atelectasis.
3. Stable right lower lobe pulmonary nodules over the course of 2
years.

## 2018-11-21 ENCOUNTER — Other Ambulatory Visit: Payer: Self-pay | Admitting: Cardiology

## 2018-12-08 DIAGNOSIS — I456 Pre-excitation syndrome: Secondary | ICD-10-CM | POA: Insufficient documentation

## 2018-12-08 NOTE — Progress Notes (Deleted)
Cardiology Office Note   Date:  12/08/2018   ID:  Adam Watson, DOB 02-26-1961, MRN [id number]  PCP:  Cheral BayHawks, Aldene N, MD  Cardiologist:   Rollene RotundaJames Ardine Iacovelli, MD  Referring:  Dr. Thurston HoleWainer  No chief complaint on file.     History of Present Illness: Adam Watson is a 58 y.o. male who I saw for follow up of aortic stenosis with bicuspid valve and WPW history with ablation [facility].   I did send him for an echo and he had no stenosis with a bicuspid aortic valve in 2018.  ***   At a previous visit he was having some lightheadedness.  I stopped his beta blocker.  I did restart a low dose of ARB.  However, he still had some mild HTN.  He also has a rapid resting heart rate.  I increased his losartan/hct.   I added Norvasc.  He returns for follow up.    Since I last saw him he has done well.  The patient denies any new symptoms such as chest discomfort, neck or arm discomfort. There has been no new shortness of breath, PND or orthopnea. There have been no reported palpitations, presyncope or syncope.  He walks routinely.     Past Medical History:  Diagnosis Date  . Bicuspid aortic valve   . COPD (chronic obstructive pulmonary disease) (HCC)   . Diarrhea    hx  . GERD (gastroesophageal reflux disease)   . History of kidney stones   . History of stomach ulcers   . Hypertension    no meds  . PONV (postoperative nausea and vomiting)   . Post-traumatic osteoarthritis of right knee 04/07/2016  . Pulmonary nodules   . Wart    "inside my nose" (04/20/2016)  . WPW (Wolff-Parkinson-White syndrome)     Past Surgical History:  Procedure Laterality Date  . ANTERIOR CERVICAL DECOMP/DISCECTOMY FUSION  ~ 2011  . APPENDECTOMY    . BACK SURGERY    . CARDIAC ELECTROPHYSIOLOGY MAPPING AND ABLATION  1990s   "for WPW"  . CERVICAL SPINE SURGERY     "trimmed discs"  . CYSTOSCOPY WITH URETEROSCOPY, STONE BASKETRY AND STENT PLACEMENT    . JOINT REPLACEMENT    . KNEE ARTHROSCOPY Right 09/2014;  08/2015  . LAPAROSCOPIC CHOLECYSTECTOMY    . PARTIAL KNEE ARTHROPLASTY Right 04/20/2016   Procedure: KNEE ARTHROPLASY WITH  MENISCECTOMY;  Surgeon: Salvatore Marvelobert Wainer, MD;  [facility];  Service: Orthopedics;  Laterality: Right;  . TOTAL KNEE ARTHROPLASTY Right 04/20/2016  . TOTAL KNEE REVISION Right 04/20/2016   Procedure: TOTAL KNEE;  Surgeon: Salvatore Marvelobert Wainer, MD;  [facility];  Service: Orthopedics;  Laterality: Right;     Current Outpatient Medications  Medication Sig Dispense Refill  . amLODipine (NORVASC) 2.5 MG tablet Take 1 tablet (2.5 mg total) by mouth daily. *NEEDS OFFICE VISIT* 30 tablet 0  . aspirin 81 MG chewable tablet Chew 81 mg by mouth daily.    Marland Kitchen. losartan-hydrochlorothiazide (HYZAAR) 50-12.5 MG tablet Take 1 tablet by mouth daily. 90 tablet 3  . Multiple Vitamins-Minerals (MULTIVITAMIN PO) Take 1 tablet by mouth daily.    Marland Kitchen. omeprazole (PRILOSEC) 40 MG capsule Take 1 capsule (40 mg total) by mouth daily. 90 capsule 3  . sildenafil (VIAGRA) 50 MG tablet Take 1 tablet (50 mg total) by mouth daily as needed for erectile dysfunction. 10 tablet 0   No current facility-administered medications for this visit.     Allergies:  Codeine, Penicillins, and Tetracyclines & related    ROS:  Please see the history of present illness.   Otherwise, review of systems are positive for ***.   All other systems are reviewed and negative.    PHYSICAL EXAM: VS:  There were no vitals taken for this visit. , BMI There is no height or weight on file to calculate BMI.  GENERAL:  Well appearing NECK:  No jugular venous distention, waveform within normal limits, carotid upstroke brisk and symmetric, no bruits, no thyromegaly LUNGS:  Clear to auscultation bilaterally CHEST:  Unremarkable HEART:  PMI not displaced or sustained,S1 and S2 within normal limits, no S3, no S4, no clicks, no rubs, *** murmurs ABD:  Flat, positive bowel sounds normal in frequency in pitch, no bruits, no rebound, no  guarding, no midline pulsatile mass, no hepatomegaly, no splenomegaly EXT:  2 plus pulses throughout, no edema, no cyanosis no clubbing    ***GENERAL:  Well appearing NECK:  No jugular venous distention, waveform within normal limits, carotid upstroke brisk and symmetric, no bruits, no thyromegaly LUNGS:  Clear to auscultation bilaterally CHEST:  Unremarkable HEART:  PMI not displaced or sustained,S1 and S2 within normal limits, no S3, no S4, no clicks, no rubs, very soft apical early peaking systolic murmur, no diastolic murmurs ABD:  Flat, positive bowel sounds normal in frequency in pitch, no bruits, no rebound, no guarding, no midline pulsatile mass, no hepatomegaly, no splenomegaly EXT:  2 plus pulses throughout, no edema, no cyanosis no clubbing   EKG:  EKG is *** ordered today. Sinus rate ***, axis within normal limits, intervals within normal limits, no acute ST-T wave changes.  Recent Labs: No results found for requested labs within last 8760 hours.    Lipid Panel No results found for: CHOL, TRIG, HDL, CHOLHDL, VLDL, LDLCALC, LDLDIRECT    Wt Readings from Last 3 Encounters:  11/04/17 265 lb 9.6 oz (120.5 kg)  10/02/16 261 lb 9.6 oz (118.7 kg)  08/07/16 264 lb 3.2 oz (119.8 kg)      Other studies Reviewed: Additional studies/ records that were reviewed today include: *** Review of the above records demonstrates:   ***   ASSESSMENT AND PLAN:   WPW:    ***  He has had no recurrent symptoms.  No change in therapy.    BICUSPID AORTIC VALVE:   *** He had no stenosis on echo.  I will follow this clinically.   HTN: The blood pressure is *** at target.  No change in therapy.   OBESITY:  ***   We talked again about weight loss strategies.    ED:  ***  I will give him one prescription of Viagra.    Current medicines are reviewed at length with the patient today.  The patient does not have concerns regarding medicines.  The following changes have been made:  ***   Labs/ tests ordered today include:  ***  No orders of the defined types were placed in this encounter.    Disposition:   FU with me in *** months.    Signed, Minus Breeding, MD  12/08/2018 7:53 AM    [facility]

## 2018-12-09 ENCOUNTER — Ambulatory Visit: Payer: BLUE CROSS/BLUE SHIELD | Admitting: Cardiology

## 2019-01-27 ENCOUNTER — Ambulatory Visit: Payer: BC Managed Care – PPO | Admitting: Physician Assistant

## 2019-03-20 NOTE — Progress Notes (Deleted)
Cardiology Office Note   Date:  03/20/2019   ID:  Adam MoraleJohn R Watson, DOB 30-Jan-1961, MRN [id number]  PCP:  Cheral BayHawks, Aldene N, MD Cardiologist:  Rollene RotundaJames Hochrein, MD 11/04/2017 Electrphysiologist: None Theodore Demarkhonda Cheril Slattery, PA-C   No chief complaint on file.   History of Present Illness: Adam Watson is a 58 y.o. male with a history of for HTN (no meds), bicuspid aortic valve, WPW with [facility], COPD and GERD   Adam Watson presents for ***   Past Medical History:  Diagnosis Date  . Bicuspid aortic valve   . COPD (chronic obstructive pulmonary disease) (HCC)   . Diarrhea    hx  . GERD (gastroesophageal reflux disease)   . History of kidney stones   . History of stomach ulcers   . Hypertension    no meds  . PONV (postoperative nausea and vomiting)   . Post-traumatic osteoarthritis of right knee 04/07/2016  . Pulmonary nodules   . Wart    "inside my nose" (04/20/2016)  . WPW (Wolff-Parkinson-White syndrome)     Past Surgical History:  Procedure Laterality Date  . ANTERIOR CERVICAL DECOMP/DISCECTOMY FUSION  ~ 2011  . APPENDECTOMY    . BACK SURGERY    . CARDIAC ELECTROPHYSIOLOGY MAPPING AND ABLATION  1990s   "for WPW"  . CERVICAL SPINE SURGERY     "trimmed discs"  . CYSTOSCOPY WITH URETEROSCOPY, STONE BASKETRY AND STENT PLACEMENT    . JOINT REPLACEMENT    . KNEE ARTHROSCOPY Right 09/2014; 08/2015  . LAPAROSCOPIC CHOLECYSTECTOMY    . PARTIAL KNEE ARTHROPLASTY Right 04/20/2016   Procedure: KNEE ARTHROPLASY WITH  MENISCECTOMY;  Surgeon: Salvatore Marvelobert Wainer, MD;  [facility];  Service: Orthopedics;  Laterality: Right;  . TOTAL KNEE ARTHROPLASTY Right 04/20/2016  . TOTAL KNEE REVISION Right 04/20/2016   Procedure: TOTAL KNEE;  Surgeon: Salvatore Marvelobert Wainer, MD;  [facility];  Service: Orthopedics;  Laterality: Right;    Current Outpatient Medications  Medication Sig Dispense Refill  . amLODipine (NORVASC) 2.5 MG tablet Take 1 tablet (2.5 mg total) by mouth  daily. *NEEDS OFFICE VISIT* 30 tablet 0  . aspirin 81 MG chewable tablet Chew 81 mg by mouth daily.    Marland Kitchen. losartan-hydrochlorothiazide (HYZAAR) 50-12.5 MG tablet Take 1 tablet by mouth daily. 90 tablet 3  . Multiple Vitamins-Minerals (MULTIVITAMIN PO) Take 1 tablet by mouth daily.    Marland Kitchen. omeprazole (PRILOSEC) 40 MG capsule Take 1 capsule (40 mg total) by mouth daily. 90 capsule 3  . sildenafil (VIAGRA) 50 MG tablet Take 1 tablet (50 mg total) by mouth daily as needed for erectile dysfunction. 10 tablet 0   No current facility-administered medications for this visit.    Allergies:   Codeine, Penicillins, and Tetracyclines & related    Social History:  The patient  reports that he has never smoked. He has never used smokeless tobacco. He reports that he does not drink alcohol or use drugs.   Family History:  The patient's family history includes Cancer in his father; Diverticulitis in his brother; Heart attack in his brother; Hypertension in his brother and mother; Kidney failure in his mother; Lupus in his mother.  He indicated that his mother is alive. He indicated that his father is deceased. He indicated that his sister is alive. He indicated that both of his brothers are alive. He indicated that his maternal grandmother is deceased. He indicated that his maternal grandfather is deceased. He indicated that  his paternal grandmother is deceased. He indicated that his paternal grandfather is deceased. He indicated that both of his daughters are alive.    ROS:  Please see the history of present illness. All other systems are reviewed and negative.    PHYSICAL EXAM: VS:  There were no vitals taken for this visit. , BMI There is no height or weight on file to calculate BMI. GEN: Well nourished, well developed, male in no acute distress HEENT: normal for age  Neck: no JVD, no carotid bruit, no masses Cardiac: RRR; no murmur, no rubs, or gallops Respiratory:  clear to auscultation bilaterally,  normal work of breathing GI: soft, nontender, nondistended, + BS MS: no deformity or atrophy; no edema; distal pulses are 2+ in all 4 extremities  Skin: warm and dry, no rash Neuro:  Strength and sensation are intact Psych: euthymic mood, full affect   EKG:  EKG {ACTION; IS/IS EPP:29518841} ordered today. The ekg ordered today demonstrates ***  ECHO: 04/15/2016 - Left ventricle: The cavity size was normal. Wall thickness was   normal. Systolic function was normal. The estimated ejection   fraction was in the range of 55% to 60%. Features are consistent   with a pseudonormal left ventricular filling pattern, with   concomitant abnormal relaxation and increased filling pressure   (grade 2 diastolic dysfunction). - Aortic valve: Bicuspid; mildly calcified leaflets. There was no   stenosis. There was trivial regurgitation. Mean gradient (S): 8   mm Hg. - Aorta: Mildly dilated aortic root. Aortic root dimension: 40 mm   (ED). - Mitral valve: Mildly calcified annulus. There was no significant   regurgitation. - Right ventricle: The cavity size was normal. Systolic function   was normal. - Tricuspid valve: Peak RV-RA gradient (S): 30 mm Hg. - Pulmonary arteries: PA peak pressure: 33 mm Hg (S). - Inferior vena cava: The vessel was normal in size. The   respirophasic diameter changes were in the normal range (= 50%),   consistent with normal central venous pressure.  Impressions:  - Normal LV size with EF 55-60%. Moderate diastolic dysfunction.   Normal RV size and systolic function. Bicuspid aortic valve with   no significant stenosis, trivial AI. Mildly dilated aortic root   Recent Labs: No results found for requested labs within last 8760 hours.  CBC    Component Value Date/Time   WBC 16.8 (H) 04/22/2016 0536   RBC 4.04 (L) 04/22/2016 0536   HGB 12.8 (L) 04/22/2016 0536   HCT 37.5 (L) 04/22/2016 0536   PLT 224 04/22/2016 0536   MCV 92.8 04/22/2016 0536   MCH 31.7  04/22/2016 0536   MCHC 34.1 04/22/2016 0536   RDW 13.6 04/22/2016 0536   LYMPHSABS 2.1 02/27/2009 1510   MONOABS 0.8 02/27/2009 1510   EOSABS 0.1 02/27/2009 1510   BASOSABS 0.1 02/27/2009 1510   CMP Latest Ref Rng & Units 10/02/2016 04/22/2016 04/21/2016  Glucose 65 - 99 mg/dL 103(H) 176(H) 237(H)  BUN 6 - 24 mg/dL 13 16 13   Creatinine 0.76 - 1.27 mg/dL 1.01 0.98 1.17  Sodium 134 - 144 mmol/L 138 135 136  Potassium 3.5 - 5.2 mmol/L 4.2 4.7 3.9  Chloride 96 - 106 mmol/L 100 102 106  CO2 20 - 29 mmol/L 23 26 22   Calcium 8.7 - 10.2 mg/dL 9.8 9.1 9.2  Total Protein 6.0 - 8.3 g/dL - - -  Total Bilirubin 0.3 - 1.2 mg/dL - - -  Alkaline Phos 39 - 117 U/L - - -  AST 0 - 37 U/L - - -  ALT 0 - 53 U/L - - -     Lipid Panel No results found for: CHOL, HDL, LDLCALC, LDLDIRECT, TRIG, CHOLHDL    Wt Readings from Last 3 Encounters:  11/04/17 265 lb 9.6 oz (120.5 kg)  10/02/16 261 lb 9.6 oz (118.7 kg)  08/07/16 264 lb 3.2 oz (119.8 kg)     Other studies Reviewed: Additional studies/ records that were reviewed today include: Office notes, hospital records and testing.  ASSESSMENT AND PLAN:  1.  ***   Current medicines are reviewed at length with the patient today.  The patient {ACTIONS; HAS/DOES NOT HAVE:19233} concerns regarding medicines.  The following changes have been made:  {PLAN; NO CHANGE:13088:s}  Labs/ tests ordered today include:  No orders of the defined types were placed in this encounter.    Disposition:   FU with Rollene Rotunda, MD  Signed, Theodore Demark, PA-C  03/20/2019 4:57 PM    [facility] Phone: [phone]; Fax: [phone]

## 2019-03-23 ENCOUNTER — Ambulatory Visit: Payer: BC Managed Care – PPO | Admitting: Physician Assistant

## 2019-04-02 DIAGNOSIS — Z7189 Other specified counseling: Secondary | ICD-10-CM | POA: Insufficient documentation

## 2019-04-02 DIAGNOSIS — N529 Male erectile dysfunction, unspecified: Secondary | ICD-10-CM | POA: Insufficient documentation

## 2019-04-02 NOTE — Progress Notes (Signed)
Cardiology Office Note   Date:  04/03/2019   ID:  Adam Watson, DOB September 07, 1960, MRN [id number]  PCP:  Bonnita Nasuti, MD  Cardiologist:   Minus Breeding, MD  Referring:  Dr. Noemi Chapel   Chief Complaint  Patient presents with  . Fatigue     History of Present Illness: Adam Watson is a 59 y.o. male who I saw for follow up of aortic stenosis with bicuspid valve and WPW history with ablation at [facility] in 1990's.   I did send him for an echo and he had no stenosis with a bicuspid aortic valve.  At a previous visit he was having some lightheadedness.  I stopped his beta blocker.  I did restart a low dose of ARB.  However, he still had some mild HTN.  He also has a rapid resting heart rate.  I increased his Losartan/hct.   I added Norvasc.  He returns for follow up.    Since I last saw him he has done okay.  He did have Covid in November and he says that he is not really felt well since then.  Has been fatigued.  He has been a little more short of breath.  He did not really have a severe bout.  He has not had cough fevers or chills.  He has not had any PND or orthopnea.  He does not describe chest discomfort, neck or arm discomfort.  He has had no palpitations, presyncope or syncope.  He is not overly physically active but he does some chores around the house.  He has some chronic dyspnea associated with this but I do not think is acutely changed.  He does describe fatigue but he does not sleep well either.  He has some soreness in his posterior neck and between his shoulder blades it seems to be more musculoskeletal.    Past Medical History:  Diagnosis Date  . Bicuspid aortic valve   . COPD (chronic obstructive pulmonary disease) ([place])   . Diarrhea    hx  . GERD (gastroesophageal reflux disease)   . History of kidney stones   . History of stomach ulcers   . Hypertension    no meds  . PONV (postoperative nausea and vomiting)   . Post-traumatic osteoarthritis of right knee 04/07/2016  .  Pulmonary nodules   . Wart    "inside my nose" (04/20/2016)  . WPW (Wolff-Parkinson-White syndrome)     Past Surgical History:  Procedure Laterality Date  . ANTERIOR CERVICAL DECOMP/DISCECTOMY FUSION  ~ 2011  . APPENDECTOMY    . BACK SURGERY    . CARDIAC ELECTROPHYSIOLOGY MAPPING AND ABLATION  1990s   "for WPW"  . CERVICAL SPINE SURGERY     "trimmed discs"  . CYSTOSCOPY WITH URETEROSCOPY, STONE BASKETRY AND STENT PLACEMENT    . JOINT REPLACEMENT    . KNEE ARTHROSCOPY Right 09/2014; 08/2015  . LAPAROSCOPIC CHOLECYSTECTOMY    . PARTIAL KNEE ARTHROPLASTY Right 04/20/2016   Procedure: KNEE ARTHROPLASY WITH  MENISCECTOMY;  Surgeon: Elsie Saas, MD;  Location: [place];  Service: Orthopedics;  Laterality: Right;  . TOTAL KNEE ARTHROPLASTY Right 04/20/2016  . TOTAL KNEE REVISION Right 04/20/2016   Procedure: TOTAL KNEE;  Surgeon: Elsie Saas, MD;  Location: [place];  Service: Orthopedics;  Laterality: Right;     Current Outpatient Medications  Medication Sig Dispense Refill  . amLODipine (NORVASC) 2.5 MG tablet Take 1 tablet (2.5 mg total) by mouth daily. *NEEDS OFFICE VISIT* 90  tablet 3  . aspirin 81 MG chewable tablet Chew 81 mg by mouth daily.    Marland Kitchen losartan-hydrochlorothiazide (HYZAAR) 50-12.5 MG tablet Take 1 tablet by mouth daily. 90 tablet 3  . Multiple Vitamins-Minerals (MULTIVITAMIN PO) Take 1 tablet by mouth daily.    Marland Kitchen omeprazole (PRILOSEC) 40 MG capsule Take 1 capsule (40 mg total) by mouth daily. 90 capsule 3  . sildenafil (VIAGRA) 50 MG tablet Take 1 tablet (50 mg total) by mouth daily as needed for erectile dysfunction. 10 tablet 3   No current facility-administered medications for this visit.    Allergies:   Codeine, Penicillins, and Tetracyclines & related    ROS:  Please see the history of present illness.   Otherwise, review of systems are positive for none.   All other systems are reviewed and negative.    PHYSICAL EXAM: VS:  BP (!) 137/93   Pulse (!) 101   Ht 6'  1" (1.854 m)   Wt 273 lb (123.8 kg)   SpO2 96%   BMI 36.02 kg/m  , BMI Body mass index is 36.02 kg/m.  GENERAL:  Well appearing NECK:  No jugular venous distention, waveform within normal limits, carotid upstroke brisk and symmetric, no bruits, no thyromegaly LUNGS:  Clear to auscultation bilaterally CHEST:  Unremarkable HEART:  PMI not displaced or sustained,S1 and S2 within normal limits, no S3, no S4, no clicks, no rubs, no murmurs ABD:  Flat, positive bowel sounds normal in frequency in pitch, no bruits, no rebound, no guarding, no midline pulsatile mass, no hepatomegaly, no splenomegaly EXT:  2 plus pulses throughout, no edema, no cyanosis no clubbing   EKG:  EKG is  ordered today. Sinus rate 101, axis within normal limits, intervals within normal limits, no acute ST-T wave changes.  Recent Labs: No results found for requested labs within last 8760 hours.    Lipid Panel No results found for: CHOL, TRIG, HDL, CHOLHDL, VLDL, LDLCALC, LDLDIRECT    Wt Readings from Last 3 Encounters:  04/03/19 273 lb (123.8 kg)  11/04/17 265 lb 9.6 oz (120.5 kg)  10/02/16 261 lb 9.6 oz (118.7 kg)      Other studies Reviewed: Additional studies/ records that were reviewed today include: No Review of the above records demonstrates:      ASSESSMENT AND PLAN:   WPW:     He has had no further tachypalpitations since his ablation.  No change in therapy.   BICUSPID AORTIC VALVE:   I do not appreciate a murmur.  However, given his Covid, fatigue and continued dyspnea I will be ordering an echo and will assess his bicuspid valve at that point.   HTN: The blood pressure is at target at home.  No change in therapy.  He is overdue for blood work and this will be as below.    OBESITY: We have talked about weight loss strategies.  DYSPNEA: He describes dyspnea but mostly fatigue which could be a "long haul" Covid symptoms.  Given the fact that there can be myocardial involvement he will get an  echocardiogram.  I will also be checking a CBC.  Comprehensive metabolic profile and a TSH.  FATIGUE: Warm snoring, daytime somnolence, apneic episodes.  He has an elevated Epworth sleepiness score and very likely has sleep apnea.  Sleep study is indicated.  RISK REDUCTION: We will get a lipid profile.  ED: I will renew his Viagra.  COVID EDUCATION: He would be interested in getting the vaccination when it is  his turn.  We talked about this.  Current medicines are reviewed at length with the patient today.  The patient does not have concerns regarding medicines.  The following changes have been made:    None  Labs/ tests ordered today include:      Orders Placed This Encounter  Procedures  . Comprehensive Metabolic Panel (CMET)  . CBC  . TSH  . Lipid Profile  . EKG 12-Lead  . ECHOCARDIOGRAM COMPLETE  . Split night study     Disposition:   FU with me in 18 months.    Signed, Rollene Rotunda, MD  04/03/2019 11:00 AM    [facility]

## 2019-04-03 ENCOUNTER — Ambulatory Visit (INDEPENDENT_AMBULATORY_CARE_PROVIDER_SITE_OTHER): Payer: BC Managed Care – PPO | Admitting: Cardiology

## 2019-04-03 ENCOUNTER — Encounter: Payer: Self-pay | Admitting: Cardiology

## 2019-04-03 ENCOUNTER — Other Ambulatory Visit: Payer: Self-pay

## 2019-04-03 VITALS — BP 137/93 | HR 101 | Ht 73.0 in | Wt 273.0 lb

## 2019-04-03 DIAGNOSIS — Q231 Congenital insufficiency of aortic valve: Secondary | ICD-10-CM

## 2019-04-03 DIAGNOSIS — G4730 Sleep apnea, unspecified: Secondary | ICD-10-CM

## 2019-04-03 DIAGNOSIS — Z7189 Other specified counseling: Secondary | ICD-10-CM | POA: Diagnosis not present

## 2019-04-03 DIAGNOSIS — E7800 Pure hypercholesterolemia, unspecified: Secondary | ICD-10-CM

## 2019-04-03 DIAGNOSIS — I10 Essential (primary) hypertension: Secondary | ICD-10-CM

## 2019-04-03 DIAGNOSIS — N529 Male erectile dysfunction, unspecified: Secondary | ICD-10-CM

## 2019-04-03 DIAGNOSIS — I456 Pre-excitation syndrome: Secondary | ICD-10-CM

## 2019-04-03 MED ORDER — SILDENAFIL CITRATE 50 MG PO TABS: 50.0000 mg | ORAL_TABLET | Freq: Every day | ORAL | 3 refills | Status: AC | PRN

## 2019-04-03 MED ORDER — AMLODIPINE BESYLATE 2.5 MG PO TABS: 2.5000 mg | ORAL_TABLET | Freq: Every day | ORAL | 3 refills | Status: DC

## 2019-04-03 MED ORDER — LOSARTAN POTASSIUM-HCTZ 50-12.5 MG PO TABS: 1.0000 | ORAL_TABLET | Freq: Every day | ORAL | 3 refills | Status: AC

## 2019-04-03 MED ORDER — OMEPRAZOLE 40 MG PO CPDR: 40.0000 mg | DELAYED_RELEASE_CAPSULE | Freq: Every day | ORAL | 3 refills | Status: DC

## 2019-04-03 NOTE — Patient Instructions (Addendum)
Medication Instructions:  No changes, medications refilled  *If you need a refill on your cardiac medications before your next appointment, please call your pharmacy*  Lab Work: Your physician recommends that you return for lab work today (CBC, CMET, TSH, Lipids)  If you have labs (blood work) drawn today and your tests are completely normal, you will receive your results only by: Marland Kitchen MyChart Message (if you have MyChart) OR . A paper copy in the mail If you have any lab test that is abnormal or we need to change your treatment, we will call you to review the results.  Testing/Procedures: Your physician has requested that you have an echocardiogram. Echocardiography is a painless test that uses sound waves to create images of your heart. It provides your doctor with information about the size and shape of your heart and how well your heart's chambers and valves are working. This procedure takes approximately one hour. There are no restrictions for this procedure. [address]  Your physician has recommended that you have a sleep study. This test records several body functions during sleep, including: brain activity, eye movement, oxygen and carbon dioxide blood levels, heart rate and rhythm, breathing rate and rhythm, the flow of air through your mouth and nose, snoring, body muscle movements, and chest and belly movement. [facility]   Follow-Up: At [facility], you and your health needs are our priority.  As part of our continuing mission to provide you with exceptional heart care, we have created designated Provider Care Teams.  These Care Teams include your primary Cardiologist (physician) and Advanced Practice Providers (APPs -  Physician Assistants and Nurse Practitioners) who all work together to provide you with the care you need, when you need it.  Your next appointment:   Your physician wants you to follow-up in: 1.5 years. You will receive a reminder letter  in the mail two months in advance. If you don't receive a letter, please call our office to schedule the follow-up appointment.  The format for your next appointment:   In Person  Provider:   Rollene Rotunda, MD

## 2019-04-04 ENCOUNTER — Telehealth: Payer: Self-pay | Admitting: Cardiology

## 2019-04-04 LAB — COMPREHENSIVE METABOLIC PANEL
ALT: 55 IU/L — ABNORMAL HIGH (ref 0–44)
AST: 33 IU/L (ref 0–40)
Albumin/Globulin Ratio: 1.5 (ref 1.2–2.2)
Albumin: 4.4 g/dL (ref 3.8–4.9)
Alkaline Phosphatase: 69 IU/L (ref 39–117)
BUN/Creatinine Ratio: 19 (ref 9–20)
BUN: 17 mg/dL (ref 6–24)
Bilirubin Total: 1 mg/dL (ref 0.0–1.2)
CO2: 23 mmol/L (ref 20–29)
Calcium: 9.4 mg/dL (ref 8.7–10.2)
Chloride: 101 mmol/L (ref 96–106)
Creatinine, Ser: 0.9 mg/dL (ref 0.76–1.27)
GFR calc Af Amer: 109 mL/min/{1.73_m2} (ref >59)
GFR calc non Af Amer: 94 mL/min/{1.73_m2} (ref >59)
Globulin, Total: 2.9 g/dL (ref 1.5–4.5)
Glucose: 110 mg/dL — ABNORMAL HIGH (ref 65–99)
Potassium: 4.1 mmol/L (ref 3.5–5.2)
Sodium: 139 mmol/L (ref 134–144)
Total Protein: 7.3 g/dL (ref 6.0–8.5)

## 2019-04-04 LAB — CBC
Hematocrit: 46 % (ref 37.5–51.0)
Hemoglobin: 16.6 g/dL (ref 13.0–17.7)
MCH: 34 pg — ABNORMAL HIGH (ref 26.6–33.0)
MCHC: 36.1 g/dL — ABNORMAL HIGH (ref 31.5–35.7)
MCV: 94 fL (ref 79–97)
Platelets: 218 10*3/uL (ref 150–450)
RBC: 4.88 x10E6/uL (ref 4.14–5.80)
RDW: 12.7 % (ref 11.6–15.4)
WBC: 7 10*3/uL (ref 3.4–10.8)

## 2019-04-04 LAB — LIPID PANEL
Chol/HDL Ratio: 4.2 ratio (ref 0.0–5.0)
Cholesterol, Total: 180 mg/dL (ref 100–199)
HDL: 43 mg/dL (ref >39)
LDL Chol Calc (NIH): 111 mg/dL — ABNORMAL HIGH (ref 0–99)
Triglycerides: 144 mg/dL (ref 0–149)
VLDL Cholesterol Cal: 26 mg/dL (ref 5–40)

## 2019-04-04 LAB — TSH: TSH: 1.16 u[IU]/mL (ref 0.450–4.500)

## 2019-04-04 NOTE — Telephone Encounter (Signed)
Patient confirmed echo appt tomorrow at 8:20am

## 2019-04-05 ENCOUNTER — Ambulatory Visit (HOSPITAL_COMMUNITY): Payer: BC Managed Care – PPO | Attending: Cardiology

## 2019-04-05 ENCOUNTER — Other Ambulatory Visit (INDEPENDENT_AMBULATORY_CARE_PROVIDER_SITE_OTHER): Payer: BC Managed Care – PPO

## 2019-04-05 ENCOUNTER — Other Ambulatory Visit: Payer: Self-pay

## 2019-04-05 DIAGNOSIS — I10 Essential (primary) hypertension: Secondary | ICD-10-CM | POA: Diagnosis not present

## 2019-04-05 DIAGNOSIS — I456 Pre-excitation syndrome: Secondary | ICD-10-CM | POA: Diagnosis not present

## 2019-04-05 DIAGNOSIS — Q231 Congenital insufficiency of aortic valve: Secondary | ICD-10-CM

## 2019-04-06 ENCOUNTER — Other Ambulatory Visit: Payer: Self-pay | Admitting: Cardiology

## 2019-04-06 ENCOUNTER — Telehealth: Payer: Self-pay | Admitting: *Deleted

## 2019-04-06 DIAGNOSIS — J449 Chronic obstructive pulmonary disease, unspecified: Secondary | ICD-10-CM

## 2019-04-06 DIAGNOSIS — R0681 Apnea, not elsewhere classified: Secondary | ICD-10-CM

## 2019-04-06 DIAGNOSIS — R0683 Snoring: Secondary | ICD-10-CM

## 2019-04-06 DIAGNOSIS — R5383 Other fatigue: Secondary | ICD-10-CM

## 2019-04-06 DIAGNOSIS — R400 Somnolence: Secondary | ICD-10-CM

## 2019-04-06 NOTE — Telephone Encounter (Signed)
Patient notified [employer] denied in lab sleep study. Authorized a HST which has been scheduled for 06/05/19 @ 12 noon.

## 2019-04-12 ENCOUNTER — Telehealth: Payer: Self-pay | Admitting: Physician Assistant

## 2019-04-12 NOTE — Telephone Encounter (Signed)
[name], [name]  04/11/2019 10:21 AM EST    Please let him know his heart muscle function is normal  Not a lot of Leakage across his valve. F/u with Dr Antoine Poche as scheduled.   Informed pt of echo results. Pt verbalized understanding and thanks for the call.

## 2019-06-05 ENCOUNTER — Ambulatory Visit (HOSPITAL_BASED_OUTPATIENT_CLINIC_OR_DEPARTMENT_OTHER): Payer: BC Managed Care – PPO | Attending: Cardiology | Admitting: Cardiovascular Disease

## 2019-06-05 ENCOUNTER — Other Ambulatory Visit: Payer: Self-pay

## 2019-06-05 VITALS — Ht 73.0 in | Wt 265.0 lb

## 2019-06-05 DIAGNOSIS — G4733 Obstructive sleep apnea (adult) (pediatric): Secondary | ICD-10-CM | POA: Diagnosis not present

## 2019-06-05 DIAGNOSIS — R0902 Hypoxemia: Secondary | ICD-10-CM | POA: Insufficient documentation

## 2019-06-05 DIAGNOSIS — R0681 Apnea, not elsewhere classified: Secondary | ICD-10-CM

## 2019-06-05 DIAGNOSIS — Z7982 Long term (current) use of aspirin: Secondary | ICD-10-CM | POA: Insufficient documentation

## 2019-06-05 DIAGNOSIS — R5383 Other fatigue: Secondary | ICD-10-CM

## 2019-06-05 DIAGNOSIS — Z79899 Other long term (current) drug therapy: Secondary | ICD-10-CM | POA: Insufficient documentation

## 2019-06-05 DIAGNOSIS — J449 Chronic obstructive pulmonary disease, unspecified: Secondary | ICD-10-CM | POA: Diagnosis not present

## 2019-06-05 DIAGNOSIS — R400 Somnolence: Secondary | ICD-10-CM

## 2019-06-05 DIAGNOSIS — R0683 Snoring: Secondary | ICD-10-CM | POA: Diagnosis present

## 2019-06-16 ENCOUNTER — Encounter (HOSPITAL_BASED_OUTPATIENT_CLINIC_OR_DEPARTMENT_OTHER): Payer: Self-pay | Admitting: Cardiovascular Disease

## 2019-06-16 NOTE — Procedures (Addendum)
    Patient Name: Adam Watson, Adam Watson Date: 06/06/2019 Gender: Male D.O.B: [birth year] Age (years): 17 Referring Provider: Nicki Guadalajara MD, ABSM Height (inches): 73 Interpreting Physician: Nicki Guadalajara MD, ABSM Weight (lbs): 273 RPSGT: [place] BMI: 36 MRN: [id number] Neck Size: 17.00  CLINICAL INFORMATION Sleep Study Type: HST  Indication for sleep study: COPD, Fatigue, Snoring, Witnesses Apnea / Gasping During Sleep  Epworth Sleepiness Score: 17  SLEEP STUDY TECHNIQUE A multi-channel overnight portable sleep study was performed. The channels recorded were: nasal airflow, thoracic respiratory movement, and oxygen saturation with a pulse oximetry. Snoring was also monitored.  MEDICATIONS amLODipine (NORVASC) 2.5 MG tablet aspirin 81 MG chewable tablet losartan-hydrochlorothiazide (HYZAAR) 50-12.5 MG tablet Multiple Vitamins-Minerals (MULTIVITAMIN PO) omeprazole (PRILOSEC) 40 MG capsule sildenafil (VIAGRA) 50 MG tablet Patient self administered medications include: N/A.  SLEEP ARCHITECTURE Patient was studied for 301 minutes. The sleep efficiency was 100.0 % and the patient was supine for 32.5%. The arousal index was 0.0 per hour.  RESPIRATORY PARAMETERS The overall AHI was 34.7 per hour, with a central apnea index of 0.0 per hour.  The oxygen nadir was 83% during sleep. Time spent below 89% was 10.4 minutes.  CARDIAC DATA Mean heart rate during sleep was 73.5 bpm.  IMPRESSIONS - Severe obstructive sleep apnea occurred during this study (AHI = 34.7/h). - No significant central sleep apnea occurred during this study (CAI = 0.0/h). - Moderate oxygen desaturation to a nadir of 83%. - Patient snored 24.1% during the sleep.  DIAGNOSIS - Obstructive Sleep Apnea (327.23 [G47.33 ICD-10]) - Nocturnal Hypoxemia (327.26 [G47.36 ICD-10]  RECOMMENDATIONS - In this patient with cardiovascular comorbidities and severe sleep apnea, recommend an in-lab CPAP titration  study to optimally evaluate the patient's sleep disordered breathing. - Effort should be made to optimize nasal and oropharyngeal patency. - Positional therapy avoiding supine position during sleep. - Avoid alcohol, sedatives and other CNS depressants that may worsen sleep apnea and disrupt normal sleep architecture. - Sleep hygiene should be reviewed to assess factors that may improve sleep quality. - Weight management and regular exercise should be initiated or continued. - Recommend a download and sleep clinic evaluation CPAP therapy.   [Electronically signed] 06/16/2019 04:56 PM  Nicki Guadalajara MD, [facility], ABSM Diplomate, American Board of Sleep Medicine   NPI: [id number]  [facility] PH: [phone]   FX: [phone] ACCREDITED BY THE AMERICAN ACADEMY OF SLEEP MEDICINE

## 2019-06-20 ENCOUNTER — Other Ambulatory Visit: Payer: Self-pay | Admitting: Cardiovascular Disease

## 2019-06-20 ENCOUNTER — Telehealth: Payer: Self-pay | Admitting: *Deleted

## 2019-06-20 DIAGNOSIS — G4733 Obstructive sleep apnea (adult) (pediatric): Secondary | ICD-10-CM

## 2019-06-20 DIAGNOSIS — I10 Essential (primary) hypertension: Secondary | ICD-10-CM

## 2019-06-20 NOTE — Telephone Encounter (Signed)
Patient notified of HST results and recommendations. 

## 2019-06-21 ENCOUNTER — Telehealth: Payer: Self-pay | Admitting: *Deleted

## 2019-06-21 NOTE — Telephone Encounter (Signed)
Patient notified of COVID and sleep study appointment details and quarantine instructions.

## 2019-07-14 ENCOUNTER — Other Ambulatory Visit (HOSPITAL_COMMUNITY)
Admission: RE | Admit: 2019-07-14 | Discharge: 2019-07-14 | Disposition: A | Discharge status: Home / Self Care | Payer: BC Managed Care – PPO | Source: Ambulatory Visit | Attending: Cardiovascular Disease | Admitting: Cardiovascular Disease

## 2019-07-14 DIAGNOSIS — Z20822 Contact with and (suspected) exposure to covid-19: Secondary | ICD-10-CM | POA: Diagnosis not present

## 2019-07-14 DIAGNOSIS — Z01812 Encounter for preprocedural laboratory examination: Secondary | ICD-10-CM | POA: Diagnosis present

## 2019-07-14 LAB — SARS CORONAVIRUS 2 (TAT 6-24 HRS): SARS Coronavirus 2: NEGATIVE

## 2019-07-16 ENCOUNTER — Other Ambulatory Visit: Payer: Self-pay

## 2019-07-16 ENCOUNTER — Ambulatory Visit (HOSPITAL_BASED_OUTPATIENT_CLINIC_OR_DEPARTMENT_OTHER): Payer: BC Managed Care – PPO | Attending: Cardiovascular Disease | Admitting: Cardiovascular Disease

## 2019-07-16 DIAGNOSIS — G4733 Obstructive sleep apnea (adult) (pediatric): Secondary | ICD-10-CM | POA: Insufficient documentation

## 2019-07-16 DIAGNOSIS — I10 Essential (primary) hypertension: Secondary | ICD-10-CM | POA: Diagnosis not present

## 2019-07-22 ENCOUNTER — Encounter (HOSPITAL_BASED_OUTPATIENT_CLINIC_OR_DEPARTMENT_OTHER): Payer: Self-pay | Admitting: Cardiovascular Disease

## 2019-07-22 NOTE — Procedures (Signed)
Patient Name: Adam Watson, Adam Watson Date: 07/16/2019 Gender: Male D.O.B: 23-Aug-1960 Age (years): 12 Referring Provider: Shelva Majestic MD, ABSM Height (inches): 71 Interpreting Physician: Shelva Majestic MD, ABSM Weight (lbs): 275 RPSGT: Baxter Flattery BMI: 34 MRN: [id number] Neck Size: 17.00  CLINICAL INFORMATION The patient is referred for a CPAP titration to treat sleep apnea.  Date of HST: 06/05/2019:  AHI 34.7/h; O2 nadir 83%.  SLEEP STUDY TECHNIQUE As per the AASM Manual for the Scoring of Sleep and Associated Events v2.3 (April 2016) with a hypopnea requiring 4% desaturations.  The channels recorded and monitored were frontal, central and occipital EEG, electrooculogram (EOG), submentalis EMG (chin), nasal and oral airflow, thoracic and abdominal wall motion, anterior tibialis EMG, snore microphone, electrocardiogram, and pulse oximetry. Continuous positive airway pressure (CPAP) was initiated at the beginning of the study and titrated to treat sleep-disordered breathing.  MEDICATIONS amLODipine (NORVASC) 2.5 MG tablet aspirin 81 MG chewable tablet losartan-hydrochlorothiazide (HYZAAR) 50-12.5 MG tablet Multiple Vitamins-Minerals (MULTIVITAMIN PO) omeprazole (PRILOSEC) 40 MG capsule sildenafil (VIAGRA) 50 MG tablet Medications self-administered by patient taken the night of the study : N/A  TECHNICIAN COMMENTS Comments added by technician: Patient had difficulty initiating sleep. SIMPLUS FULL FACE MASK MEDIUM Comments added by scorer: N/A  RESPIRATORY PARAMETERS Optimal PAP Pressure (cm): 12 AHI at Optimal Pressure (/hr): 0.0 Overall Minimal O2 (%): 87.0 Supine % at Optimal Pressure (%): 100 Minimal O2 at Optimal Pressure (%): 92.0   SLEEP ARCHITECTURE The study was initiated at 10:48:11 PM and ended at 5:01:53 AM.  Sleep onset time was 10.7 minutes and the sleep efficiency was 73.2%%. The total sleep time was 273.5 minutes.  The patient spent 3.3%% of the night  in stage N1 sleep, 72.2%% in stage N2 sleep, 5.7%% in stage N3 and 18.8% in REM.Stage REM latency was 114.5 minutes  Wake after sleep onset was 89.5. Alpha intrusion was absent. Supine sleep was 49.72%.  CARDIAC DATA The 2 lead EKG demonstrated sinus rhythm. The mean heart rate was 63.8 beats per minute. Other EKG findings include: None.  LEG MOVEMENT DATA The total Periodic Limb Movements of Sleep (PLMS) were 0. The PLMS index was 0.0. A PLMS index of <15 is considered normal in adults.  IMPRESSIONS - CPAP was initiated at 5 cm and was titrated to optimal PAP pressure at 12 cm of water; AHI 0; O2 nadir 92%. - Central sleep apnea was not noted during this titration (CAI = 0.0/h). - Mild oxygen desaturations were observed to a nadir of 87% at 5 cm. - No snoring was audible during this study. - No cardiac abnormalities were observed during this study. - Clinically significant periodic limb movements were not noted during this study. Arousals associated with PLMs were rare.  DIAGNOSIS - Obstructive Sleep Apnea (327.23 [G47.33 ICD-10])  RECOMMENDATIONS - Recommend an initial trial of CPAP therapy with EPR at 12 cm H2O with heated humidification.  A Medium size Fisher&Paykel Full Face Mask Simplus mask was used for the titration.  - Effort should be made to optimize nasal and oropharyngeal patency. - Avoid alcohol, sedatives and other CNS depressants that may worsen sleep apnea and disrupt normal sleep architecture. - Sleep hygiene should be reviewed to assess factors that may improve sleep quality. - Weight management (BMI 38) and regular exercise should be initiated or continued. - Recommend a download in 30 days and sleep Center evaluation after 4 weeks of therapy.   [Electronically signed] 07/22/2019 11:03 AM  Shelva Majestic MD, [facility],  ABSM Diplomate, American Board of Sleep Medicine   NPI: [id number] [facility] PH: [phone]   FX: [phone] ACCREDITED BY THE AMERICAN ACADEMY OF SLEEP MEDICINE

## 2019-07-25 NOTE — Progress Notes (Signed)
Results letter mailed to patient.

## 2019-08-29 ENCOUNTER — Telehealth: Payer: Self-pay | Admitting: *Deleted

## 2019-08-29 NOTE — Telephone Encounter (Signed)
CPAP orders faxed to [facility]. 

## 2019-12-30 ENCOUNTER — Encounter: Payer: Self-pay | Admitting: Pulmonary Disease

## 2020-01-22 ENCOUNTER — Institutional Professional Consult (permissible substitution): Payer: BC Managed Care – PPO | Admitting: Pulmonary Disease

## 2020-02-21 ENCOUNTER — Ambulatory Visit (INDEPENDENT_AMBULATORY_CARE_PROVIDER_SITE_OTHER): Payer: Self-pay | Admitting: Pulmonary Disease

## 2020-02-21 ENCOUNTER — Other Ambulatory Visit: Payer: Self-pay

## 2020-02-21 ENCOUNTER — Encounter: Payer: Self-pay | Admitting: Pulmonary Disease

## 2020-02-21 VITALS — BP 138/88 | HR 92 | Temp 98.2°F | Ht 72.0 in | Wt 276.8 lb

## 2020-02-21 DIAGNOSIS — R0602 Shortness of breath: Secondary | ICD-10-CM

## 2020-02-21 DIAGNOSIS — G4733 Obstructive sleep apnea (adult) (pediatric): Secondary | ICD-10-CM

## 2020-02-21 MED ORDER — ESZOPICLONE 2 MG PO TABS: 2.0000 mg | ORAL_TABLET | Freq: Every evening | ORAL | 1 refills | Status: DC | PRN

## 2020-02-21 NOTE — Progress Notes (Signed)
Adam Watson    [id number]    Aug 09, 1960  Primary Care Physician:Hague, Myrene Galas, MD  Referring Physician: Hilton Cork, MD [address] [place],  [place] [address]  Chief complaint:   Patient being seen for obstructive sleep apnea  HPI:  Diagnosed with severe obstructive sleep apnea Titration study did reveal optimal pressure of 12 Prescription was sent into [facility] Patient stated he got a CPAP in the mail Watched a zoom video for set up  Has not been able to tolerate up  Has had the machine for a few months now He last tried it about 2 weeks ago  He has severe dryness of his mouth both with and without any_ Usually goes to bed about 10:50 PM Takes him about 10 minutes to fall asleep Wakes up about every hour Final wake up time between 7 and 730  Has been having significant difficulty falling asleep and staying asleep Usually with the mask on, finds it very difficult to fall asleep  Weight has remained stable  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 amLODipine (NORVASC) 2.5 MG tablet Take 1 tablet (2.5 mg total) by mouth daily. *NEEDS OFFICE VISIT*  . aspirin 81 MG chewable tablet Chew 81 mg by mouth daily.  Marland Kitchen atorvastatin (LIPITOR) 20 MG tablet Take by mouth.  . losartan-hydrochlorothiazide (HYZAAR) 50-12.5 MG tablet Take 1 tablet by mouth daily.  . Multiple Vitamins-Minerals (MULTIVITAMIN PO) Take 1 tablet by mouth daily.  Marland Kitchen omeprazole (PRILOSEC) 40 MG capsule Take 1 capsule (40 mg total) by mouth daily.  . sildenafil (VIAGRA) 50 MG tablet Take 1 tablet (50 mg total) by mouth daily as needed for erectile dysfunction.  . topiramate (TOPAMAX) 50 MG tablet    No facility-administered encounter medications on file as of 02/21/2020.    Allergies as of 02/21/2020 - Review Complete 02/21/2020  Allergen Reaction Noted  .  Codeine Other (See Comments) 04/07/2016  . Penicillins Hives and Rash 09/14/2013  . Tetracyclines & related Other (See Comments) 06/18/2016    Past Medical History:  Diagnosis Date  . Bicuspid aortic valve   . COPD (chronic obstructive pulmonary disease) (HCC)   . Diarrhea    hx  . GERD (gastroesophageal reflux disease)   . History of kidney stones   . History of stomach ulcers   . Hypertension    no meds  . PONV (postoperative nausea and vomiting)   . Post-traumatic osteoarthritis of right knee 04/07/2016  . Pulmonary nodules   . Wart    "inside my nose" (04/20/2016)  . WPW (Wolff-Parkinson-White syndrome)     Past Surgical History:  Procedure Laterality Date  . ANTERIOR CERVICAL DECOMP/DISCECTOMY FUSION  ~ 2011  . APPENDECTOMY    . BACK SURGERY    . CARDIAC ELECTROPHYSIOLOGY MAPPING AND ABLATION  1990s   "for WPW"  . CERVICAL SPINE SURGERY     "trimmed discs"  . CYSTOSCOPY WITH URETEROSCOPY, STONE BASKETRY AND STENT PLACEMENT    . JOINT REPLACEMENT    . KNEE ARTHROSCOPY Right 09/2014; 08/2015  . LAPAROSCOPIC CHOLECYSTECTOMY    . PARTIAL KNEE ARTHROPLASTY Right 04/20/2016   Procedure: KNEE ARTHROPLASY WITH  MENISCECTOMY;  Surgeon: Salvatore Marvel, MD;  Location[facility] OR;  Service: Orthopedics;  Laterality: Right;  . TOTAL KNEE ARTHROPLASTY Right 04/20/2016  . TOTAL KNEE REVISION Right  04/20/2016   Procedure: TOTAL KNEE;  Surgeon: Salvatore Marvel, MD;  Location: [facility] OR;  Service: Orthopedics;  Laterality: Right;    Family History  Problem Relation Age of Onset  . Hypertension Mother   . Lupus Mother   . Kidney failure Mother   . Cancer Father        throat and lungs  . Hypertension Brother   . Heart attack Brother   . Diverticulitis Brother     Social History   Socioeconomic History  . Marital status: Married    Spouse name: Not on file  . Number of children: 2  . Years of education: Not on file  . Highest education level: Not on file  Occupational History  .  Occupation: Trucker  Tobacco Use  . Smoking status: Never Smoker  . Smokeless tobacco: Never Used  Vaping Use  . Vaping Use: Never used  Substance and Sexual Activity  . Alcohol use: No  . Drug use: No  . Sexual activity: Not Currently  Other Topics Concern  . Not on file  Social History Narrative   Eight grandchildren.     Social Determinants of Health   Financial Resource Strain:   . Difficulty of Paying Living Expenses: Not on file  Food Insecurity:   . Worried About Programme researcher, broadcasting/film/video in the Last Year: Not on file  . Ran Out of Food in the Last Year: Not on file  Transportation Needs:   . Lack of Transportation (Medical): Not on file  . Lack of Transportation (Non-Medical): Not on file  Physical Activity:   . Days of Exercise per Week: Not on file  . Minutes of Exercise per Session: Not on file  Stress:   . Feeling of Stress : Not on file  Social Connections:   . Frequency of Communication with Friends and Family: Not on file  . Frequency of Social Gatherings with Friends and Family: Not on file  . Attends Religious Services: Not on file  . Active Member of Clubs or Organizations: Not on file  . Attends Banker Meetings: Not on file  . Marital Status: Not on file  Intimate Partner Violence:   . Fear of Current or Ex-Partner: Not on file  . Emotionally Abused: Not on file  . Physically Abused: Not on file  . Sexually Abused: Not on file    Review of Systems  Respiratory: Positive for apnea and shortness of breath.   Psychiatric/Behavioral: Positive for sleep disturbance.    Vitals:   02/21/20 0936  BP: 138/88  Pulse: 92  SpO2: 96%     Physical Exam Constitutional:      Appearance: He is obese.  HENT:     Head: Normocephalic.     Nose: No congestion.     Mouth/Throat:     Mouth: Mucous membranes are moist.     Pharynx: No oropharyngeal exudate.     Comments: Mallampati 4, crowded oropharynx, macroglossia Eyes:     General:         Right eye: No discharge.        Left eye: No discharge.     Pupils: Pupils are equal, round, and reactive to light.  Cardiovascular:     Rate and Rhythm: Normal rate and regular rhythm.     Pulses: Normal pulses.     Heart sounds: Normal heart sounds. No murmur heard.  No friction rub.  Pulmonary:     Effort: No respiratory distress.  Breath sounds: No stridor. No wheezing or rhonchi.  Musculoskeletal:     Cervical back: No rigidity or tenderness.  Neurological:     Mental Status: He is alert.  Psychiatric:        Mood and Affect: Mood normal.    Results of the Epworth flowsheet 02/21/2020  Sitting and reading 3  Watching TV 3  Sitting, inactive in a public place (e.g. a theatre or a meeting) 2  As a passenger in a car for an hour without a break 2  Lying down to rest in the afternoon when circumstances permit 3  Sitting and talking to someone 1  Sitting quietly after a lunch without alcohol 2  In a car, while stopped for a few minutes in traffic 1  Total score 17    Data Reviewed: Previous CT scan of the chest 04/21/2016 reviewed by myself showing lung nodules-reported to be stable over the last 2 years-film was reviewed by myself  Sleep study was reviewed CPAP titration study reviewed  Assessment:  Severe obstructive sleep apnea Intolerant of CPAP at present  Obesity  Excessive daytime sleepiness secondary to untreated sleep disordered breathing  History of lung nodules -Stable from previous CTs  Pathophysiology of sleep disordered breathing reviewed with the patient Treatment options discussed with the patient  Plan/Recommendations: DME referral-apnea to have machine assessed  Lunesta for sleep onset and sleep maintenance insomnia -Instructed not to use Lunesta without CPAP on  DME company should be able to educate him about how to set up the CPAP  Encouraged to call with any significant concerns  Follow-up in 3 months   Virl Diamond MD [place]  Pulmonary and Critical Care 02/21/2020, 9:38 AM  CC: Lottie Mussel*

## 2020-02-21 NOTE — Patient Instructions (Signed)
Patient with shortness of breath Obstructive sleep apnea  Contact [facility]-DME -Troubleshoot machine to make sure it set upright -Should be on a CPAP of 12 with heated humidification  Pulmonary function test to assess lung functions  I will see you back in 3 months  I will send in a prescription for Lunesta 2 mg nightly to help with CPAP tolerance and multiple awakenings Do not use the sleep aid without using your CPAP  Call with significant concerns

## 2020-03-20 ENCOUNTER — Ambulatory Visit (INDEPENDENT_AMBULATORY_CARE_PROVIDER_SITE_OTHER): Payer: Medicare HMO | Admitting: Pulmonary Disease

## 2020-03-20 ENCOUNTER — Other Ambulatory Visit: Payer: Self-pay

## 2020-03-20 DIAGNOSIS — R0602 Shortness of breath: Secondary | ICD-10-CM | POA: Diagnosis not present

## 2020-03-20 LAB — PULMONARY FUNCTION TEST
DL/VA % pred: 113 %
DL/VA: 4.79 ml/min/mmHg/L
DLCO cor % pred: 92 %
DLCO cor: 27.56 ml/min/mmHg
DLCO unc % pred: 92 %
DLCO unc: 27.56 ml/min/mmHg
FEF 25-75 Post: 3.98 L/sec
FEF 25-75 Pre: 2.81 L/sec
FEF2575-%Change-Post: 41 %
FEF2575-%Pred-Post: 122 %
FEF2575-%Pred-Pre: 86 %
FEV1-%Change-Post: 8 %
FEV1-%Pred-Post: 90 %
FEV1-%Pred-Pre: 83 %
FEV1-Post: 3.55 L
FEV1-Pre: 3.27 L
FEV1FVC-%Change-Post: 3 %
FEV1FVC-%Pred-Pre: 102 %
FEV6-%Change-Post: 4 %
FEV6-%Pred-Post: 88 %
FEV6-%Pred-Pre: 84 %
FEV6-Post: 4.37 L
FEV6-Pre: 4.16 L
FEV6FVC-%Change-Post: 0 %
FEV6FVC-%Pred-Post: 104 %
FEV6FVC-%Pred-Pre: 104 %
FVC-%Change-Post: 4 %
FVC-%Pred-Post: 84 %
FVC-%Pred-Pre: 80 %
FVC-Post: 4.37 L
FVC-Pre: 4.17 L
Post FEV1/FVC ratio: 81 %
Post FEV6/FVC ratio: 100 %
Pre FEV1/FVC ratio: 78 %
Pre FEV6/FVC Ratio: 100 %
RV % pred: 87 %
RV: 2.02 L
TLC % pred: 83 %
TLC: 6.16 L

## 2020-03-20 NOTE — Progress Notes (Signed)
PFT done today. 

## 2020-06-10 ENCOUNTER — Telehealth: Payer: Self-pay

## 2020-06-10 NOTE — Telephone Encounter (Signed)
Spoke with patient about bringing SD card from CPAP machine to visit tomorrow. Nothing further needed.

## 2020-06-11 ENCOUNTER — Telehealth: Payer: Self-pay | Admitting: Pulmonary Disease

## 2020-06-11 ENCOUNTER — Other Ambulatory Visit: Payer: Self-pay

## 2020-06-11 ENCOUNTER — Encounter: Payer: Self-pay | Admitting: Pulmonary Disease

## 2020-06-11 ENCOUNTER — Ambulatory Visit: Payer: Medicare HMO | Admitting: Pulmonary Disease

## 2020-06-11 VITALS — BP 122/72 | HR 100 | Temp 97.9°F | Ht 73.0 in | Wt 282.0 lb

## 2020-06-11 DIAGNOSIS — R0981 Nasal congestion: Secondary | ICD-10-CM

## 2020-06-11 DIAGNOSIS — G4733 Obstructive sleep apnea (adult) (pediatric): Secondary | ICD-10-CM

## 2020-06-11 NOTE — Progress Notes (Signed)
Adam Watson    [id number]    Dec 10, 1960  Primary Care Physician:Hague, Myrene Galas, MD  Referring Physician: Galvin Proffer, MD [address] [place],  [place] [address]  Chief complaint:   Patient being seen for obstructive sleep apnea  HPI:  Not able to tolerate his CPAP because of significant nasal stuffiness and congestion  Has been using Vicks As tried other interventions that has not helped  Not able to tolerate CPAP and has had problems for good while now  During his last visit a couple months ago he was not using a CPAP at that time as well  Diagnosed with severe obstructive sleep apnea Titration study did reveal optimal pressure of 12 Prescription was sent into [facility] Patient stated he got a CPAP in the mail Watched a zoom video for set up  He has severe dryness of his mouth in AM  Takes him about 10 minutes to fall asleep Wakes up about every hour Final wake up time between 7 and 730  Has been having significant difficulty falling asleep and staying asleep Usually with the mask on, finds it very difficult to fall asleep  Weight has remained stable  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 amLODipine (NORVASC) 2.5 MG tablet Take 1 tablet (2.5 mg total) by mouth daily. *NEEDS OFFICE VISIT*  . aspirin 81 MG chewable tablet Chew 81 mg by mouth daily.  Marland Kitchen atorvastatin (LIPITOR) 20 MG tablet Take by mouth.  . eszopiclone (LUNESTA) 2 MG TABS tablet Take 1 tablet (2 mg total) by mouth at bedtime as needed for sleep. Take immediately before bedtime  . lisinopril (ZESTRIL) 5 MG tablet   . loratadine (CLARITIN) 10 MG tablet Take 10 mg by mouth daily.  Marland Kitchen losartan-hydrochlorothiazide (HYZAAR) 50-12.5 MG tablet Take 1 tablet by mouth daily.  . Multiple Vitamins-Minerals (MULTIVITAMIN PO) Take 1 tablet by mouth daily.  Marland Kitchen omeprazole  (PRILOSEC) 40 MG capsule Take 1 capsule (40 mg total) by mouth daily.  . sildenafil (VIAGRA) 50 MG tablet Take 1 tablet (50 mg total) by mouth daily as needed for erectile dysfunction.  . topiramate (TOPAMAX) 50 MG tablet    No facility-administered encounter medications on file as of 06/11/2020.    Allergies as of 06/11/2020 - Review Complete 06/11/2020  Allergen Reaction Noted  . Codeine Other (See Comments) 04/07/2016  . Penicillins Hives and Rash 09/14/2013  . Tetracyclines & related Other (See Comments) 06/18/2016    Past Medical History:  Diagnosis Date  . Bicuspid aortic valve   . COPD (chronic obstructive pulmonary disease) (HCC)   . Diarrhea    hx  . GERD (gastroesophageal reflux disease)   . History of kidney stones   . History of stomach ulcers   . Hypertension    no meds  . PONV (postoperative nausea and vomiting)   . Post-traumatic osteoarthritis of right knee 04/07/2016  . Pulmonary nodules   . Wart    "inside my nose" (04/20/2016)  . WPW (Wolff-Parkinson-White syndrome)     Past Surgical History:  Procedure Laterality Date  . ANTERIOR CERVICAL DECOMP/DISCECTOMY FUSION  ~ 2011  . APPENDECTOMY    . BACK SURGERY    . CARDIAC ELECTROPHYSIOLOGY MAPPING AND ABLATION  1990s   "for WPW"  . CERVICAL SPINE SURGERY     "trimmed discs"  .  CYSTOSCOPY WITH URETEROSCOPY, STONE BASKETRY AND STENT PLACEMENT    . JOINT REPLACEMENT    . KNEE ARTHROSCOPY Right 09/2014; 08/2015  . LAPAROSCOPIC CHOLECYSTECTOMY    . PARTIAL KNEE ARTHROPLASTY Right 04/20/2016   Procedure: KNEE ARTHROPLASY WITH  MENISCECTOMY;  Surgeon: Salvatore Marvel, MD;  Location: [facility] OR;  Service: Orthopedics;  Laterality: Right;  . TOTAL KNEE ARTHROPLASTY Right 04/20/2016  . TOTAL KNEE REVISION Right 04/20/2016   Procedure: TOTAL KNEE;  Surgeon: Salvatore Marvel, MD;  Location: [facility] OR;  Service: Orthopedics;  Laterality: Right;    Family History  Problem Relation Age of Onset  . Hypertension Mother   . Lupus  Mother   . Kidney failure Mother   . Cancer Father        throat and lungs  . Hypertension Brother   . Heart attack Brother   . Diverticulitis Brother     Social History   Socioeconomic History  . Marital status: Married    Spouse name: Not on file  . Number of children: 2  . Years of education: Not on file  . Highest education level: Not on file  Occupational History  . Occupation: Trucker  Tobacco Use  . Smoking status: Never Smoker  . Smokeless tobacco: Never Used  Vaping Use  . Vaping Use: Never used  Substance and Sexual Activity  . Alcohol use: No  . Drug use: No  . Sexual activity: Not Currently  Other Topics Concern  . Not on file  Social History Narrative   Eight grandchildren.     Social Determinants of Health   Financial Resource Strain: Not on file  Food Insecurity: Not on file  Transportation Needs: Not on file  Physical Activity: Not on file  Stress: Not on file  Social Connections: Not on file  Intimate Partner Violence: Not on file    Review of Systems  Constitutional: Positive for fatigue.  Respiratory: Positive for apnea and shortness of breath.   Psychiatric/Behavioral: Positive for sleep disturbance.    Vitals:   06/11/20 1037  BP: 122/72  Pulse: 100  Temp: 97.9 F (36.6 C)  SpO2: 94%     Physical Exam Constitutional:      Appearance: He is obese.  HENT:     Head: Normocephalic.     Nose: No congestion.     Mouth/Throat:     Mouth: Mucous membranes are moist.     Pharynx: No oropharyngeal exudate.     Comments: Mallampati 4, crowded oropharynx, macroglossia Eyes:     General:        Right eye: No discharge.        Left eye: No discharge.     Pupils: Pupils are equal, round, and reactive to light.  Cardiovascular:     Rate and Rhythm: Normal rate and regular rhythm.     Pulses: Normal pulses.     Heart sounds: Normal heart sounds. No murmur heard. No friction rub.  Pulmonary:     Effort: No respiratory distress.      Breath sounds: No stridor. No wheezing or rhonchi.  Musculoskeletal:     Cervical back: No rigidity or tenderness.  Neurological:     Mental Status: He is alert.  Psychiatric:        Mood and Affect: Mood normal.    Results of the Epworth flowsheet 02/21/2020  Sitting and reading 3  Watching TV 3  Sitting, inactive in a public place (e.g. a theatre or a meeting) 2  As  a passenger in a car for an hour without a break 2  Lying down to rest in the afternoon when circumstances permit 3  Sitting and talking to someone 1  Sitting quietly after a lunch without alcohol 2  In a car, while stopped for a few minutes in traffic 1  Total score 17   Data Reviewed: Previous CT scan of the chest 04/21/2016 reviewed by myself showing lung nodules-reported to be stable over the last 2 years-film was reviewed by myself  Sleep study was reviewed CPAP titration study reviewed  Assessment:  Severe obstructive sleep apnea -Remains intolerant of CPAP -Significant nasal stuffiness and congestion  Obesity  Excessive daytime sleepiness secondary to untreated sleep disordered breathing  History of lung nodules -Stable from previous CTs  Pathophysiology of sleep disordered breathing reviewed with the patient Treatment options discussed with the patient  Plan/Recommendations: Referral to Dr. Jenne Pane for evaluation for an inspire device  ENT evaluation by Dr. Jenne Pane for his persistent nasal stuffiness and congestion  Trial with Flonase  Encouraged to call with any significant concerns  Follow-up in 3 months   Virl Diamond MD  Pulmonary and Critical Care 06/11/2020, 10:50 AM  CC: Galvin Proffer, MD

## 2020-06-11 NOTE — Patient Instructions (Signed)
Referral to Dr. Jenne Pane for inspire device evaluation  ENT referral-Dr. Zerita Boers nasal congestion  Flonase or Nasonex for nasal stuffiness    I will see you back in 3 months  Once you start using Flonase or Nasonex, if it helps, try using your CPAP

## 2020-06-12 NOTE — Telephone Encounter (Signed)
I will send referral to [facility].  I spoke to pt and made him aware.  Nothing further needed.

## 2020-06-12 NOTE — Telephone Encounter (Signed)
Spoke with the pt  He states that Dr Jenne Pane office called him yesterday about referral for inspire device  They advised him that they do not do the inspire, and that referral should be sent to [facility]  Will forward to [facility] for this to be done Thank you

## 2020-07-20 LAB — COLOGUARD: COLOGUARD: NEGATIVE

## 2020-09-03 ENCOUNTER — Telehealth: Payer: Self-pay | Admitting: *Deleted

## 2020-09-03 NOTE — Telephone Encounter (Signed)
Adam Watson, has a new provider at [facility].

## 2022-02-24 DIAGNOSIS — J449 Chronic obstructive pulmonary disease, unspecified: Secondary | ICD-10-CM | POA: Diagnosis not present

## 2022-02-24 DIAGNOSIS — R7303 Prediabetes: Secondary | ICD-10-CM | POA: Diagnosis not present

## 2022-02-24 DIAGNOSIS — E559 Vitamin D deficiency, unspecified: Secondary | ICD-10-CM | POA: Diagnosis not present

## 2022-02-24 DIAGNOSIS — E782 Mixed hyperlipidemia: Secondary | ICD-10-CM | POA: Diagnosis not present

## 2022-02-24 DIAGNOSIS — M25562 Pain in left knee: Secondary | ICD-10-CM | POA: Diagnosis not present

## 2022-02-24 DIAGNOSIS — G4733 Obstructive sleep apnea (adult) (pediatric): Secondary | ICD-10-CM | POA: Diagnosis not present

## 2022-02-24 DIAGNOSIS — I10 Essential (primary) hypertension: Secondary | ICD-10-CM | POA: Diagnosis not present

## 2022-02-26 DIAGNOSIS — I83812 Varicose veins of left lower extremities with pain: Secondary | ICD-10-CM | POA: Diagnosis not present

## 2022-02-26 DIAGNOSIS — I83892 Varicose veins of left lower extremities with other complications: Secondary | ICD-10-CM | POA: Diagnosis not present

## 2022-03-02 DIAGNOSIS — R2242 Localized swelling, mass and lump, left lower limb: Secondary | ICD-10-CM | POA: Diagnosis not present

## 2022-03-18 DIAGNOSIS — E559 Vitamin D deficiency, unspecified: Secondary | ICD-10-CM | POA: Diagnosis not present

## 2022-03-18 DIAGNOSIS — M150 Primary generalized (osteo)arthritis: Secondary | ICD-10-CM | POA: Diagnosis not present

## 2022-03-18 DIAGNOSIS — E782 Mixed hyperlipidemia: Secondary | ICD-10-CM | POA: Diagnosis not present

## 2022-03-18 DIAGNOSIS — J449 Chronic obstructive pulmonary disease, unspecified: Secondary | ICD-10-CM | POA: Diagnosis not present

## 2022-03-18 DIAGNOSIS — I10 Essential (primary) hypertension: Secondary | ICD-10-CM | POA: Diagnosis not present

## 2022-03-19 DIAGNOSIS — I10 Essential (primary) hypertension: Secondary | ICD-10-CM | POA: Diagnosis not present

## 2022-03-26 DIAGNOSIS — M25562 Pain in left knee: Secondary | ICD-10-CM | POA: Diagnosis not present

## 2022-04-09 DIAGNOSIS — I10 Essential (primary) hypertension: Secondary | ICD-10-CM | POA: Diagnosis not present

## 2022-04-09 DIAGNOSIS — E559 Vitamin D deficiency, unspecified: Secondary | ICD-10-CM | POA: Diagnosis not present

## 2022-04-09 DIAGNOSIS — E782 Mixed hyperlipidemia: Secondary | ICD-10-CM | POA: Diagnosis not present

## 2022-04-09 DIAGNOSIS — M150 Primary generalized (osteo)arthritis: Secondary | ICD-10-CM | POA: Diagnosis not present

## 2022-04-09 DIAGNOSIS — J449 Chronic obstructive pulmonary disease, unspecified: Secondary | ICD-10-CM | POA: Diagnosis not present

## 2022-04-14 DIAGNOSIS — U071 COVID-19: Secondary | ICD-10-CM | POA: Diagnosis not present

## 2022-04-14 DIAGNOSIS — Z20828 Contact with and (suspected) exposure to other viral communicable diseases: Secondary | ICD-10-CM | POA: Diagnosis not present

## 2022-04-19 DIAGNOSIS — I10 Essential (primary) hypertension: Secondary | ICD-10-CM | POA: Diagnosis not present

## 2022-05-13 DIAGNOSIS — J449 Chronic obstructive pulmonary disease, unspecified: Secondary | ICD-10-CM | POA: Diagnosis not present

## 2022-05-13 DIAGNOSIS — E559 Vitamin D deficiency, unspecified: Secondary | ICD-10-CM | POA: Diagnosis not present

## 2022-05-13 DIAGNOSIS — E782 Mixed hyperlipidemia: Secondary | ICD-10-CM | POA: Diagnosis not present

## 2022-05-13 DIAGNOSIS — I10 Essential (primary) hypertension: Secondary | ICD-10-CM | POA: Diagnosis not present

## 2022-05-13 DIAGNOSIS — M150 Primary generalized (osteo)arthritis: Secondary | ICD-10-CM | POA: Diagnosis not present

## 2022-05-20 DIAGNOSIS — I10 Essential (primary) hypertension: Secondary | ICD-10-CM | POA: Diagnosis not present

## 2022-05-27 DIAGNOSIS — M150 Primary generalized (osteo)arthritis: Secondary | ICD-10-CM | POA: Diagnosis not present

## 2022-05-27 DIAGNOSIS — I10 Essential (primary) hypertension: Secondary | ICD-10-CM | POA: Diagnosis not present

## 2022-05-27 DIAGNOSIS — E782 Mixed hyperlipidemia: Secondary | ICD-10-CM | POA: Diagnosis not present

## 2022-06-02 DIAGNOSIS — S46811A Strain of other muscles, fascia and tendons at shoulder and upper arm level, right arm, initial encounter: Secondary | ICD-10-CM | POA: Diagnosis not present

## 2022-06-02 DIAGNOSIS — G4700 Insomnia, unspecified: Secondary | ICD-10-CM | POA: Diagnosis not present

## 2022-06-02 DIAGNOSIS — M542 Cervicalgia: Secondary | ICD-10-CM | POA: Diagnosis not present

## 2022-06-18 DIAGNOSIS — I10 Essential (primary) hypertension: Secondary | ICD-10-CM | POA: Diagnosis not present

## 2022-06-19 DIAGNOSIS — Z6836 Body mass index (BMI) 36.0-36.9, adult: Secondary | ICD-10-CM | POA: Diagnosis not present

## 2022-06-19 DIAGNOSIS — E782 Mixed hyperlipidemia: Secondary | ICD-10-CM | POA: Diagnosis not present

## 2022-06-19 DIAGNOSIS — I10 Essential (primary) hypertension: Secondary | ICD-10-CM | POA: Diagnosis not present

## 2022-06-19 DIAGNOSIS — E559 Vitamin D deficiency, unspecified: Secondary | ICD-10-CM | POA: Diagnosis not present

## 2022-06-19 DIAGNOSIS — J449 Chronic obstructive pulmonary disease, unspecified: Secondary | ICD-10-CM | POA: Diagnosis not present

## 2022-06-19 DIAGNOSIS — M150 Primary generalized (osteo)arthritis: Secondary | ICD-10-CM | POA: Diagnosis not present

## 2022-07-09 DIAGNOSIS — E559 Vitamin D deficiency, unspecified: Secondary | ICD-10-CM | POA: Diagnosis not present

## 2022-07-09 DIAGNOSIS — Z6836 Body mass index (BMI) 36.0-36.9, adult: Secondary | ICD-10-CM | POA: Diagnosis not present

## 2022-07-09 DIAGNOSIS — J449 Chronic obstructive pulmonary disease, unspecified: Secondary | ICD-10-CM | POA: Diagnosis not present

## 2022-07-09 DIAGNOSIS — E782 Mixed hyperlipidemia: Secondary | ICD-10-CM | POA: Diagnosis not present

## 2022-07-09 DIAGNOSIS — M150 Primary generalized (osteo)arthritis: Secondary | ICD-10-CM | POA: Diagnosis not present

## 2022-07-09 DIAGNOSIS — I10 Essential (primary) hypertension: Secondary | ICD-10-CM | POA: Diagnosis not present

## 2022-07-19 DIAGNOSIS — I10 Essential (primary) hypertension: Secondary | ICD-10-CM | POA: Diagnosis not present

## 2022-07-22 DIAGNOSIS — H43399 Other vitreous opacities, unspecified eye: Secondary | ICD-10-CM | POA: Diagnosis not present

## 2022-07-30 DIAGNOSIS — Q231 Congenital insufficiency of aortic valve: Secondary | ICD-10-CM | POA: Diagnosis not present

## 2022-07-30 DIAGNOSIS — I77819 Aortic ectasia, unspecified site: Secondary | ICD-10-CM | POA: Diagnosis not present

## 2022-07-30 DIAGNOSIS — R0789 Other chest pain: Secondary | ICD-10-CM | POA: Diagnosis not present

## 2022-07-30 DIAGNOSIS — R0602 Shortness of breath: Secondary | ICD-10-CM | POA: Diagnosis not present

## 2022-08-12 DIAGNOSIS — E559 Vitamin D deficiency, unspecified: Secondary | ICD-10-CM | POA: Diagnosis not present

## 2022-08-12 DIAGNOSIS — E782 Mixed hyperlipidemia: Secondary | ICD-10-CM | POA: Diagnosis not present

## 2022-08-12 DIAGNOSIS — Z6836 Body mass index (BMI) 36.0-36.9, adult: Secondary | ICD-10-CM | POA: Diagnosis not present

## 2022-08-12 DIAGNOSIS — I10 Essential (primary) hypertension: Secondary | ICD-10-CM | POA: Diagnosis not present

## 2022-08-12 DIAGNOSIS — J449 Chronic obstructive pulmonary disease, unspecified: Secondary | ICD-10-CM | POA: Diagnosis not present

## 2022-08-12 DIAGNOSIS — M150 Primary generalized (osteo)arthritis: Secondary | ICD-10-CM | POA: Diagnosis not present

## 2022-08-18 DIAGNOSIS — I10 Essential (primary) hypertension: Secondary | ICD-10-CM | POA: Diagnosis not present

## 2022-08-19 DIAGNOSIS — I77810 Thoracic aortic ectasia: Secondary | ICD-10-CM | POA: Diagnosis not present

## 2022-08-19 DIAGNOSIS — E782 Mixed hyperlipidemia: Secondary | ICD-10-CM | POA: Diagnosis not present

## 2022-08-19 DIAGNOSIS — I10 Essential (primary) hypertension: Secondary | ICD-10-CM | POA: Diagnosis not present

## 2022-08-19 DIAGNOSIS — Q231 Congenital insufficiency of aortic valve: Secondary | ICD-10-CM | POA: Diagnosis not present

## 2022-08-19 DIAGNOSIS — G218 Other secondary parkinsonism: Secondary | ICD-10-CM | POA: Diagnosis not present

## 2022-08-19 DIAGNOSIS — R0789 Other chest pain: Secondary | ICD-10-CM | POA: Diagnosis not present

## 2022-08-19 DIAGNOSIS — I456 Pre-excitation syndrome: Secondary | ICD-10-CM | POA: Diagnosis not present

## 2023-04-29 ENCOUNTER — Other Ambulatory Visit (HOSPITAL_COMMUNITY): Payer: Self-pay | Admitting: Orthopedic Surgery

## 2023-04-29 DIAGNOSIS — M25561 Pain in right knee: Secondary | ICD-10-CM

## 2023-05-10 ENCOUNTER — Ambulatory Visit (HOSPITAL_COMMUNITY)
Admission: RE | Admit: 2023-05-10 | Discharge: 2023-05-10 | Disposition: A | Discharge status: Home / Self Care | Payer: Medicare HMO | Source: Ambulatory Visit | Attending: Orthopedic Surgery | Admitting: Orthopedic Surgery

## 2023-05-10 DIAGNOSIS — M25561 Pain in right knee: Secondary | ICD-10-CM | POA: Diagnosis present

## 2023-05-10 MED ORDER — TECHNETIUM TC 99M MEDRONATE IV KIT
20.0000 | PACK | Freq: Once | INTRAVENOUS | Status: AC | PRN
  Administered 2023-05-10: 20.5 via INTRAVENOUS

## 2023-07-29 ENCOUNTER — Other Ambulatory Visit: Payer: Self-pay

## 2023-07-29 ENCOUNTER — Emergency Department (HOSPITAL_BASED_OUTPATIENT_CLINIC_OR_DEPARTMENT_OTHER)
Admission: EM | Admit: 2023-07-29 | Discharge: 2023-07-29 | Disposition: A | Discharge status: Home / Self Care | Attending: Emergency Medicine | Admitting: Emergency Medicine

## 2023-07-29 ENCOUNTER — Encounter (HOSPITAL_BASED_OUTPATIENT_CLINIC_OR_DEPARTMENT_OTHER): Payer: Self-pay | Admitting: Emergency Medicine

## 2023-07-29 DIAGNOSIS — Z7982 Long term (current) use of aspirin: Secondary | ICD-10-CM | POA: Insufficient documentation

## 2023-07-29 DIAGNOSIS — D72829 Elevated white blood cell count, unspecified: Secondary | ICD-10-CM | POA: Diagnosis not present

## 2023-07-29 DIAGNOSIS — R739 Hyperglycemia, unspecified: Secondary | ICD-10-CM | POA: Diagnosis not present

## 2023-07-29 DIAGNOSIS — R55 Syncope and collapse: Secondary | ICD-10-CM | POA: Insufficient documentation

## 2023-07-29 LAB — COMPREHENSIVE METABOLIC PANEL WITH GFR
ALT: 30 U/L (ref 0–44)
AST: 24 U/L (ref 15–41)
Albumin: 4.6 g/dL (ref 3.5–5.0)
Alkaline Phosphatase: 67 U/L (ref 38–126)
Anion gap: 13 (ref 5–15)
BUN: 19 mg/dL (ref 8–23)
CO2: 23 mmol/L (ref 22–32)
Calcium: 9.9 mg/dL (ref 8.9–10.3)
Chloride: 99 mmol/L (ref 98–111)
Creatinine, Ser: 1.05 mg/dL (ref 0.61–1.24)
GFR, Estimated: >60 mL/min (ref >60)
Glucose, Bld: 146 mg/dL — ABNORMAL HIGH (ref 70–99)
Potassium: 4.1 mmol/L (ref 3.5–5.1)
Sodium: 135 mmol/L (ref 135–145)
Total Bilirubin: 1.9 mg/dL — ABNORMAL HIGH (ref 0.0–1.2)
Total Protein: 7.9 g/dL (ref 6.5–8.1)

## 2023-07-29 LAB — CBC
HCT: 46.3 % (ref 39.0–52.0)
Hemoglobin: 16.6 g/dL (ref 13.0–17.0)
MCH: 33.8 pg (ref 26.0–34.0)
MCHC: 35.9 g/dL (ref 30.0–36.0)
MCV: 94.3 fL (ref 80.0–100.0)
Platelets: 271 10*3/uL (ref 150–400)
RBC: 4.91 MIL/uL (ref 4.22–5.81)
RDW: 12.9 % (ref 11.5–15.5)
WBC: 11.2 10*3/uL — ABNORMAL HIGH (ref 4.0–10.5)
nRBC: 0 % (ref 0.0–0.2)

## 2023-07-29 LAB — URINALYSIS, ROUTINE W REFLEX MICROSCOPIC
Bilirubin Urine: NEGATIVE
Glucose, UA: NEGATIVE mg/dL
Ketones, ur: NEGATIVE mg/dL
Leukocytes,Ua: NEGATIVE
Nitrite: NEGATIVE
Protein, ur: 30 mg/dL — AB
Specific Gravity, Urine: 1.025 (ref 1.005–1.030)
pH: 6 (ref 5.0–8.0)

## 2023-07-29 LAB — URINALYSIS, MICROSCOPIC (REFLEX)

## 2023-07-29 LAB — TROPONIN T, HIGH SENSITIVITY
Troponin T High Sensitivity: <15 ng/L (ref <19)
Troponin T High Sensitivity: <15 ng/L (ref <19)

## 2023-07-29 LAB — CBG MONITORING, ED
Glucose-Capillary: 149 mg/dL — ABNORMAL HIGH (ref 70–99)
Glucose-Capillary: 154 mg/dL — ABNORMAL HIGH (ref 70–99)

## 2023-07-29 MED ORDER — SODIUM CHLORIDE 0.9 % IV BOLUS
1000.0000 mL | Freq: Once | INTRAVENOUS | Status: AC
  Administered 2023-07-29: 1000 mL via INTRAVENOUS

## 2023-07-29 NOTE — ED Notes (Signed)
 While taking pt orthostatic vitals pt expressed feeling light headed when changing positions from sitting to standing

## 2023-07-29 NOTE — ED Notes (Signed)
 Called lab to add troponin lab value onto initial blood collection. Troponin was timed for 1515 by lab, initial collection was 1346. Lab notified.

## 2023-07-29 NOTE — ED Triage Notes (Addendum)
 Pt POV shuffling gait- feeling faint x1 hour. Reports increased urinary urgency x2 weeks.   AOx4. Reports starting gabapentin one month ago.   Hx of WPW and aortic valve regurg.

## 2023-07-29 NOTE — ED Provider Notes (Signed)
 [facility] Provider Note   CSN: [id number] Arrival date & time: 07/29/23  1333     History  Chief Complaint  Patient presents with   Near Syncope    Adam Watson is a 63 y.o. male.  He is brought in by his wife after a near syncopal event around 1:00 today.  He said he was just talking with some people when he began experiencing feeling lightheaded like he is going to pass out his vision dimmed.  He thinks he might of passed out for few seconds.  Felt nauseous afterward and was diaphoretic.  Feels back to baseline now.  Had a similar episode about a year ago when they were adjusting his blood pressure medications.  He said he has not had much appetite today other than that he has been well.  He is on gabapentin started a month ago.  Chronic back pain.  No headache chest pain shortness of breath vomiting diarrhea urinary symptoms.  The history is provided by the patient and the spouse.  Near Syncope This is a new problem. The current episode started 1 to 2 hours ago. The problem has been resolved. Pertinent negatives include no chest pain, no abdominal pain, no headaches and no shortness of breath. Nothing aggravates the symptoms. He has tried rest for the symptoms. The treatment provided significant relief.       Home Medications Prior to Admission medications   Medication Sig Start Date End Date Taking? Authorizing Provider  amLODipine  (NORVASC ) 2.5 MG tablet Take 1 tablet (2.5 mg total) by mouth daily. *NEEDS OFFICE VISIT* 04/03/19   [name], Andra Kava, PA-C  aspirin 81 MG chewable tablet Chew 81 mg by mouth daily.    [provider]  atorvastatin (LIPITOR) 20 MG tablet Take by mouth. 11/30/19 11/29/20  [provider]  eszopiclone  (LUNESTA ) 2 MG TABS tablet Take 1 tablet (2 mg total) by mouth at bedtime as needed for sleep. Take immediately before bedtime 02/21/20 02/20/21  Margaretann Sharper, MD  lisinopril (ZESTRIL) 5 MG  tablet     [provider]  loratadine (CLARITIN) 10 MG tablet Take 10 mg by mouth daily.    [provider]  losartan -hydrochlorothiazide (HYZAAR) 50-12.5 MG tablet Take 1 tablet by mouth daily. 04/03/19   [name], Rhonda G, PA-C  Multiple Vitamins-Minerals (MULTIVITAMIN PO) Take 1 tablet by mouth daily.    [provider]  omeprazole  (PRILOSEC) 40 MG capsule Take 1 capsule (40 mg total) by mouth daily. 04/03/19   [name], Andra Kava, PA-C  sildenafil  (VIAGRA ) 50 MG tablet Take 1 tablet (50 mg total) by mouth daily as needed for erectile dysfunction. 04/03/19   [name], Andra Kava, PA-C  topiramate (TOPAMAX) 50 MG tablet  12/26/19   [provider]      Allergies    Codeine, Penicillins, and Tetracyclines & related    Review of Systems   Review of Systems  Constitutional:  Positive for appetite change and diaphoresis.  Respiratory:  Negative for shortness of breath.   Cardiovascular:  Positive for near-syncope. Negative for chest pain.  Gastrointestinal:  Negative for abdominal pain.  Musculoskeletal:  Positive for back pain.  Neurological:  Negative for headaches.    Physical Exam Updated Vital Signs BP 134/85   Pulse 73   Temp 97.8 F (36.6 C) (Oral)   Resp 17   Ht 6\' 1"  (1.854 m)   Wt 124.7 kg   SpO2 95%   BMI  36.28 kg/m  Physical Exam Vitals and nursing note reviewed.  Constitutional:      General: He is not in acute distress.    Appearance: Normal appearance. He is well-developed. He is obese.  HENT:     Head: Normocephalic and atraumatic.  Eyes:     Conjunctiva/sclera: Conjunctivae normal.  Cardiovascular:     Rate and Rhythm: Normal rate and regular rhythm.     Heart sounds: No murmur heard. Pulmonary:     Effort: Pulmonary effort is normal. No respiratory distress.     Breath sounds: Normal breath sounds.  Abdominal:     Palpations: Abdomen is soft.     Tenderness: There is no abdominal tenderness. There is no guarding or  rebound.  Musculoskeletal:        General: No deformity.     Cervical back: Neck supple.  Skin:    General: Skin is warm and dry.     Capillary Refill: Capillary refill takes less than 2 seconds.  Neurological:     General: No focal deficit present.     Mental Status: He is alert and oriented to person, place, and time.     Cranial Nerves: No cranial nerve deficit.     Sensory: No sensory deficit.     Motor: No weakness.     ED Results / Procedures / Treatments   Labs (all labs ordered are listed, but only abnormal results are displayed) Labs Reviewed  COMPREHENSIVE METABOLIC PANEL WITH GFR - Abnormal; Notable for the following components:      Result Value   Glucose, Bld 146 (*)    Total Bilirubin 1.9 (*)    All other components within normal limits  CBC - Abnormal; Notable for the following components:   WBC 11.2 (*)    All other components within normal limits  URINALYSIS, ROUTINE W REFLEX MICROSCOPIC - Abnormal; Notable for the following components:   Hgb urine dipstick TRACE (*)    Protein, ur 30 (*)    All other components within normal limits  URINALYSIS, MICROSCOPIC (REFLEX) - Abnormal; Notable for the following components:   Bacteria, UA RARE (*)    All other components within normal limits  CBG MONITORING, ED - Abnormal; Notable for the following components:   Glucose-Capillary 149 (*)    All other components within normal limits  CBG MONITORING, ED - Abnormal; Notable for the following components:   Glucose-Capillary 154 (*)    All other components within normal limits  TROPONIN T, HIGH SENSITIVITY  TROPONIN T, HIGH SENSITIVITY    EKG EKG Interpretation Date/Time:  Thursday Jul 29 2023 13:43:05 EDT Ventricular Rate:  85 PR Interval:  169 QRS Duration:  101 QT Interval:  386 QTC Calculation: 459 R Axis:   20  Text Interpretation: Sinus rhythm Low voltage, precordial leads Abnormal R-wave progression, early transition Confirmed by Lowery Rue [phone])  on 07/29/2023 2:00:37 PM  Radiology No results found.  Procedures Procedures    Medications Ordered in ED Medications  sodium chloride  0.9 % bolus 1,000 mL (0 mLs Intravenous Stopped 07/29/23 1813)    ED Course/ Medical Decision Making/ A&P Clinical Course as of 07/30/23 0951  Thu Jul 29, 2023  1617 Last cardiac echo was in 2021.  EF was 55 to 60% with bicuspid aortic valve trivial regurg [MB]  1636 Patient's orthostatics were negative although he subjectively felt dizzy lightheaded.  Will give him some IV fluids and reassess. [MB]  1815 Patient states he feels a little bit  better after the fluids although not completely back to normal.  Reviewed results of workup with him.  He is comfortable plan for discharge and I counseled him to monitor his symptoms and return if any worsening or concerning symptoms. [MB]    Clinical Course User Index [MB] Tonya Fredrickson, MD                                 Medical Decision Making Amount and/or Complexity of Data Reviewed Labs: ordered.   This patient complains of dizzy lightheaded; this involves an extensive number of treatment Options and is a complaint that carries with it a high risk of complications and morbidity. The differential includes near syncope syncope vertigo hypotension hypovolemia metabolic derangement arrhythmia  I ordered, reviewed and interpreted labs, which included CBC with mildly elevated white count, chemistries with elevated glucose, troponins flat urinalysis negative for infection I ordered medication IV fluids and reviewed PMP when indicated. Additional history obtained from patient's wife Previous records obtained and reviewed in epic including prior PCP notes Cardiac monitoring reviewed, normal sinus rhythm Social determinants considered, no significant barriers Critical Interventions: None  After the interventions stated above, I reevaluated the patient and found patient to be symptomatically improved and  dizziness is lessened Admission and further testing considered, no indications for admission but patient will need close PCP follow-up.  Recommend he monitor his blood pressures at home.  Return instructions discussed         Final Clinical Impression(s) / ED Diagnoses Final diagnoses:  Near syncope    Rx / DC Orders ED Discharge Orders     None         Tonya Fredrickson, MD 07/30/23 [phone]

## 2023-07-29 NOTE — Discharge Instructions (Addendum)
 You were seen in the emergency department for feeling lightheaded and faint.  You had blood work EKG and urinalysis that did not show any significant abnormalities.  Please rest and keep well-hydrated.  Continue your regular medications.  Follow-up with your primary care doctor.  Return if any worsening or concerning symptoms

## 2023-07-29 NOTE — ED Notes (Signed)
 2nd troponin taken to lab

## 2023-07-29 NOTE — ED Notes (Signed)
 Pt tolerated ambulation well, pt expressed they did not feel light headed while ambulating

## 2023-07-29 NOTE — ED Notes (Signed)
 EDP notified of NT findings from orthostatic vitals

## 2023-08-30 ENCOUNTER — Encounter: Payer: Self-pay | Admitting: Orthopaedic Surgery

## 2023-08-30 ENCOUNTER — Telehealth: Payer: Self-pay | Admitting: Orthopaedic Surgery

## 2023-08-30 ENCOUNTER — Other Ambulatory Visit (INDEPENDENT_AMBULATORY_CARE_PROVIDER_SITE_OTHER): Payer: Self-pay

## 2023-08-30 ENCOUNTER — Ambulatory Visit: Admitting: Orthopaedic Surgery

## 2023-08-30 DIAGNOSIS — M25551 Pain in right hip: Secondary | ICD-10-CM

## 2023-08-30 NOTE — Telephone Encounter (Signed)
 Pt will be on vacation from 09/08/23-09/16/23. Just FYI for scheduling his surgery

## 2023-08-30 NOTE — [facility] Notes (Signed)
 The patient is a 63 year old gentleman who comes in to discuss hip replacement surgery for his right hip.  He has well-documented severe end-stage arthritis of his right hip.  He has had steroid injection in that hip.  His hip pain is severe and daily and the injection did not help at all.  He does have a previous history of a right knee replacement.  He did has had back surgeries before and sees Dr. Faylene Hoots who has now sent him graciously to me to discuss right hip replacement surgery.  He does report that his right hip pain is definitely affecting his mobility, his quality of life and his actives daily living and the pain can be 10 out of 10.  Examination of his right hip shows significant reduction in range of motion with rotation of the right hip.  There is severe pain in the groin with the right hip as well.  The left hip moves smoothly and fluidly.  Standing AP pelvis and lateral the right hip shows complete loss of joint space of the right hip with the femoral head digging into the acetabulum.  There is large osteophytes around the right femoral head.  His left hip actually has superior lateral joint space narrowing and flattening of the femoral head.  At this point we discussed in length in detail hip replacement surgery.  I gave him a handout about hip replacement surgery and went over his imaging studies with him and a hip replacement model.  We discussed what the surgery involves and discussed the risks and benefits of surgery.  I was able to review all of his past medical history and medications within epic.  He is not a diabetic and not morbidly obese.  All questions and concerns were answered and addressed.  Will work on getting on the schedule for right total hip arthroplasty.

## 2023-09-07 ENCOUNTER — Telehealth: Payer: Self-pay | Admitting: Orthopaedic Surgery

## 2023-09-07 NOTE — Telephone Encounter (Signed)
 Faxed

## 2023-09-07 NOTE — Telephone Encounter (Signed)
 Martiza from [facility] called requesting office notes please fax to [phone].

## 2023-09-20 NOTE — [facility] Notes (Signed)
 Sent message, via epic in basket, requesting orders in epic from [facility].

## 2023-09-27 NOTE — Patient Instructions (Signed)
 SURGICAL WAITING ROOM VISITATION Patients having surgery or a procedure may have no more than 2 support people in the waiting area - these visitors may rotate in the visitor waiting room.   Due to an increase in RSV and influenza rates and associated hospitalizations, children ages 34 and under may not visit patients in [facility] hospitals. If the patient needs to stay at the hospital during part of their recovery, the visitor guidelines for inpatient rooms apply.  PRE-OP VISITATION  Pre-op nurse will coordinate an appropriate time for 1 support person to accompany the patient in pre-op.  This support person may not rotate.  This visitor will be contacted when the time is appropriate for the visitor to come back in the pre-op area.  Please refer to the [facility] website for the visitor guidelines for Inpatients (after your surgery is over and you are in a regular room).  You are not required to quarantine at this time prior to your surgery. However, you must do this: Hand Hygiene often Do NOT share personal items Notify your provider if you are in close contact with someone who has COVID or you develop fever 100.4 or greater, new onset of sneezing, cough, sore throat, shortness of breath or body aches.  If you test positive for Covid or have been in contact with anyone that has tested positive in the last 10 days please notify you surgeon.    Your procedure is scheduled on:  10/08/23  Report to [facility] Main Entrance: [place] entrance where the [employer] is available.   Report to admitting at: 5:15 AM  Call this number if you have any questions or problems the morning of surgery [phone]  FOLLOW ANY ADDITIONAL PRE OP INSTRUCTIONS YOU RECEIVED FROM YOUR SURGEON'S OFFICE!!!  Do not eat food after Midnight the night prior to your surgery/procedure.  After Midnight you may have the following liquids until: 4:15 AM  DAY OF SURGERY  Clear Liquid Diet Water Black  Coffee (sugar ok, NO MILK/CREAM OR CREAMERS)  Tea (sugar ok, NO MILK/CREAM OR CREAMERS) regular and decaf                             Plain Jell-O  with no fruit (NO RED)                                           Fruit ices (not with fruit pulp, NO RED)                                     Popsicles (NO RED)                                                                  Juice: NO CITRUS JUICES: only apple, WHITE grape, WHITE cranberry Sports drinks like Gatorade or Powerade (NO RED)   The day of surgery:  Drink ONE (1) Pre-Surgery Clear Ensure at: 4:15 AM the morning of surgery. Drink in one sitting. Do not sip.  This drink was given  to you during your hospital pre-op appointment visit. Nothing else to drink after completing the Pre-Surgery Clear Ensure or G2 : No candy, chewing gum or throat lozenges.    Oral Hygiene is also important to reduce your risk of infection.        Remember - BRUSH YOUR TEETH THE MORNING OF SURGERY WITH YOUR REGULAR TOOTHPASTE  Do NOT smoke after Midnight the night before surgery.  STOP TAKING all Vitamins, Herbs and supplements 1 week before your surgery.   Take ONLY these medicines the morning of surgery with A SIP OF WATER: gabapentin,pantoprazole .   If You have been diagnosed with Sleep Apnea - Bring CPAP mask and tubing day of surgery. We will provide you with a CPAP machine on the day of your surgery.                   You may not have any metal on your body including hair pins, jewelry, and body piercing  Do not wear lotions, powders, perfumes / cologne, or deodorant.   Men may shave face and neck.  Contacts, Hearing Aids, dentures or bridgework may not be worn into surgery. DENTURES WILL BE REMOVED PRIOR TO SURGERY PLEASE DO NOT APPLY Poly grip OR ADHESIVES!!!  You may bring a small overnight bag with you on the day of surgery, only pack items that are not valuable. [facility] IS NOT RESPONSIBLE   FOR VALUABLES THAT ARE LOST OR STOLEN.    Patients discharged on the day of surgery will not be allowed to drive home.  Someone NEEDS to stay with you for the first 24 hours after anesthesia.  Do not bring your home medications to the hospital. The Pharmacy will dispense medications listed on your medication list to you during your admission in the Hospital.  Special Instructions: Bring a copy of your healthcare power of attorney and living will documents the day of surgery, if you wish to have them scanned into your [facility] Records- EPIC  Please read over the following fact sheets you were given: IF YOU HAVE QUESTIONS ABOUT YOUR PRE-OP INSTRUCTIONS, PLEASE CALL [phone]  FAILURE TO FOLLOW THESE INSTRUCTIONS MAY RESULT IN THE CANCELLATION OF YOUR SURGERY  PATIENT SIGNATURE_________________________________  NURSE SIGNATURE__________________________________  ________________________________________________________________________    Pre-operative 5 CHG Bath Instructions   You can play a key role in reducing the risk of infection after surgery. Your skin needs to be as free of germs as possible. You can reduce the number of germs on your skin by washing with CHG (chlorhexidine  gluconate) soap before surgery. CHG is an antiseptic soap that kills germs and continues to kill germs even after washing.   DO NOT use if you have an allergy to chlorhexidine /CHG or antibacterial soaps. If your skin becomes reddened or irritated, stop using the CHG and notify one of our RNs at [phone].   Please shower with the CHG soap starting 4 days before surgery using the following schedule:     Please keep in mind the following:  DO NOT shave, including legs and underarms, starting the day of your first shower.   You may shave your face at any point before/day of surgery.  Place clean sheets on your bed the day you start using CHG soap. Use a clean washcloth (not used since being washed) for each shower. DO NOT sleep with pets  once you start using the CHG.   CHG Shower Instructions:  If you choose to wash your hair and private area,  wash first with your normal shampoo/soap.  After you use shampoo/soap, rinse your hair and body thoroughly to remove shampoo/soap residue.  Turn the water OFF and apply about 3 tablespoons (45 ml) of CHG soap to a CLEAN washcloth.  Apply CHG soap ONLY FROM YOUR NECK DOWN TO YOUR TOES (washing for 3-5 minutes)  DO NOT use CHG soap on face, private areas, open wounds, or sores.  Pay special attention to the area where your surgery is being performed.  If you are having back surgery, having someone wash your back for you may be helpful. Wait 2 minutes after CHG soap is applied, then you may rinse off the CHG soap.  Pat dry with a clean towel  Put on clean clothes/pajamas   If you choose to wear lotion, please use ONLY the CHG-compatible lotions on the back of this paper.     Additional instructions for the day of surgery: DO NOT APPLY any lotions, deodorants, cologne, or perfumes.   Put on clean/comfortable clothes.  Brush your teeth.  Ask your nurse before applying any prescription medications to the skin.   CHG Compatible Lotions   Aveeno Moisturizing lotion  Cetaphil Moisturizing Cream  Cetaphil Moisturizing Lotion  Clairol Herbal Essence Moisturizing Lotion, Dry Skin  Clairol Herbal Essence Moisturizing Lotion, Extra Dry Skin  Clairol Herbal Essence Moisturizing Lotion, Normal Skin  Curel Age Defying Therapeutic Moisturizing Lotion with Alpha Hydroxy  Curel Extreme Care Body Lotion  Curel Soothing Hands Moisturizing Hand Lotion  Curel Therapeutic Moisturizing Cream, Fragrance-Free  Curel Therapeutic Moisturizing Lotion, Fragrance-Free  Curel Therapeutic Moisturizing Lotion, Original Formula  Eucerin Daily Replenishing Lotion  Eucerin Dry Skin Therapy Plus Alpha Hydroxy Crme  Eucerin Dry Skin Therapy Plus Alpha Hydroxy Lotion  Eucerin Original Crme  Eucerin Original  Lotion  Eucerin Plus Crme Eucerin Plus Lotion  Eucerin TriLipid Replenishing Lotion  Keri Anti-Bacterial Hand Lotion  Keri Deep Conditioning Original Lotion Dry Skin Formula Softly Scented  Keri Deep Conditioning Original Lotion, Fragrance Free Sensitive Skin Formula  Keri Lotion Fast Absorbing Fragrance Free Sensitive Skin Formula  Keri Lotion Fast Absorbing Softly Scented Dry Skin Formula  Keri Original Lotion  Keri Skin Renewal Lotion Keri Silky Smooth Lotion  Keri Silky Smooth Sensitive Skin Lotion  Nivea Body Creamy Conditioning Oil  Nivea Body Extra Enriched Lotion  Nivea Body Original Lotion  Nivea Body Sheer Moisturizing Lotion Nivea Crme  Nivea Skin Firming Lotion  NutraDerm 30 Skin Lotion  NutraDerm Skin Lotion  NutraDerm Therapeutic Skin Cream  NutraDerm Therapeutic Skin Lotion  ProShield Protective Hand Cream  Provon moisturizing lotion   Incentive Spirometer  An incentive spirometer is a tool that can help keep your lungs clear and active. This tool measures how well you are filling your lungs with each breath. Taking long deep breaths may help reverse or decrease the chance of developing breathing (pulmonary) problems (especially infection) following: A long period of time when you are unable to move or be active. BEFORE THE PROCEDURE  If the spirometer includes an indicator to show your best effort, your nurse or respiratory therapist will set it to a desired goal. If possible, sit up straight or lean slightly forward. Try not to slouch. Hold the incentive spirometer in an upright position. INSTRUCTIONS FOR USE  Sit on the edge of your bed if possible, or sit up as far as you can in bed or on a chair. Hold the incentive spirometer in an upright position. Breathe out normally. Place the mouthpiece in  your mouth and seal your lips tightly around it. Breathe in slowly and as deeply as possible, raising the piston or the ball toward the top of the column. Hold your  breath for 3-5 seconds or for as long as possible. Allow the piston or ball to fall to the bottom of the column. Remove the mouthpiece from your mouth and breathe out normally. Rest for a few seconds and repeat Steps 1 through 7 at least 10 times every 1-2 hours when you are awake. Take your time and take a few normal breaths between deep breaths. The spirometer may include an indicator to show your best effort. Use the indicator as a goal to work toward during each repetition. After each set of 10 deep breaths, practice coughing to be sure your lungs are clear. If you have an incision (the cut made at the time of surgery), support your incision when coughing by placing a pillow or rolled up towels firmly against it. Once you are able to get out of bed, walk around indoors and cough well. You may stop using the incentive spirometer when instructed by your caregiver.  RISKS AND COMPLICATIONS Take your time so you do not get dizzy or light-headed. If you are in pain, you may need to take or ask for pain medication before doing incentive spirometry. It is harder to take a deep breath if you are having pain. AFTER USE Rest and breathe slowly and easily. It can be helpful to keep track of a log of your progress. Your caregiver can provide you with a simple table to help with this. If you are using the spirometer at home, follow these instructions: SEEK MEDICAL CARE IF:  You are having difficultly using the spirometer. You have trouble using the spirometer as often as instructed. Your pain medication is not giving enough relief while using the spirometer. You develop fever of 100.5 F (38.1 C) or higher. SEEK IMMEDIATE MEDICAL CARE IF:  You cough up bloody sputum that had not been present before. You develop fever of 102 F (38.9 C) or greater. You develop worsening pain at or near the incision site. MAKE SURE YOU:  Understand these instructions. Will watch your condition. Will get help right  away if you are not doing well or get worse. Document Released: 07/20/2006 Document Revised: 06/01/2011 Document Reviewed: 09/20/2006 [facility] Patient Information 2014 [place], [place].   ________________________________________________________________________

## 2023-09-28 ENCOUNTER — Encounter (HOSPITAL_COMMUNITY): Payer: Self-pay

## 2023-09-28 ENCOUNTER — Encounter (HOSPITAL_COMMUNITY)
Admission: RE | Admit: 2023-09-28 | Discharge: 2023-09-28 | Disposition: A | Discharge status: Home / Self Care | Source: Ambulatory Visit | Attending: Orthopaedic Surgery | Admitting: Orthopaedic Surgery

## 2023-09-28 ENCOUNTER — Other Ambulatory Visit: Payer: Self-pay

## 2023-09-28 VITALS — BP 142/86 | HR 85 | Temp 98.2°F | Ht 73.0 in | Wt 273.0 lb

## 2023-09-28 DIAGNOSIS — M1611 Unilateral primary osteoarthritis, right hip: Secondary | ICD-10-CM | POA: Diagnosis not present

## 2023-09-28 DIAGNOSIS — Z01818 Encounter for other preprocedural examination: Secondary | ICD-10-CM | POA: Insufficient documentation

## 2023-09-28 LAB — BASIC METABOLIC PANEL WITH GFR
Anion gap: 12 (ref 5–15)
BUN: 18 mg/dL (ref 8–23)
CO2: 24 mmol/L (ref 22–32)
Calcium: 9.4 mg/dL (ref 8.9–10.3)
Chloride: 102 mmol/L (ref 98–111)
Creatinine, Ser: 0.93 mg/dL (ref 0.61–1.24)
GFR, Estimated: >60 mL/min (ref >60)
Glucose, Bld: 124 mg/dL — ABNORMAL HIGH (ref 70–99)
Potassium: 3.9 mmol/L (ref 3.5–5.1)
Sodium: 138 mmol/L (ref 135–145)

## 2023-09-28 LAB — CBC
HCT: 48 % (ref 39.0–52.0)
Hemoglobin: 17 g/dL (ref 13.0–17.0)
MCH: 33.5 pg (ref 26.0–34.0)
MCHC: 35.4 g/dL (ref 30.0–36.0)
MCV: 94.5 fL (ref 80.0–100.0)
Platelets: 242 K/uL (ref 150–400)
RBC: 5.08 MIL/uL (ref 4.22–5.81)
RDW: 12.5 % (ref 11.5–15.5)
WBC: 6.6 K/uL (ref 4.0–10.5)
nRBC: 0 % (ref 0.0–0.2)

## 2023-09-28 LAB — SURGICAL PCR SCREEN
MRSA, PCR: NEGATIVE
Staphylococcus aureus: POSITIVE — AB

## 2023-09-28 NOTE — [facility] Notes (Signed)
PCR: + STAPH °

## 2023-09-28 NOTE — Progress Notes (Signed)
 For Anesthesia: PCP - Pia Kerney SQUIBB, MD  Cardiologist - [facility] . LOV: 08/19/22  Bowel Prep reminder:  Chest x-ray -  EKG - 07/29/23: EPIC Stress Test -  ECHO - 07/30/22: [facility] Cardiac Cath -  Pacemaker/ICD device last checked: Pacemaker orders received: Device Rep notified:  Spinal Cord Stimulator:N/A  Sleep Study - Yes CPAP - NO  Fasting Blood Sugar - N/A Checks Blood Sugar _____ times a day Date and result of last Hgb A1c-  Last dose of GLP1 agonist- N/A GLP1 instructions:   Last dose of SGLT-2 inhibitors- N/A SGLT-2 instructions:   Blood Thinner Instructions:N/A Aspirin Instructions: Last Dose:  Activity level: Can go up a flight of stairs and activities of daily living without stopping and without chest pain and/or shortness of breath   Able to exercise without chest pain and/or shortness of breath  Anesthesia review: Bicuspid aortic valve,COPD,HTN,WPW (Wolff-Parkinson-White syndrome)   Patient denies shortness of breath, fever, cough and chest pain at PAT appointment   Patient verbalized understanding of instructions that were reviewed over the telephone.

## 2023-09-29 NOTE — Anesthesia Preprocedure Evaluation (Addendum)
 Anesthesia Evaluation  Patient identified by MRN, date of birth, ID band Patient awake    Reviewed: Allergy & Precautions, NPO status , Patient's Chart, lab work & pertinent test results  History of Anesthesia Complications (+) PONV and history of anesthetic complications  Airway Mallampati: IV  TM Distance: >3 FB Neck ROM: Full    Dental  (+) Teeth Intact, Dental Advisory Given   Pulmonary sleep apnea (unable to tolerate CPAP) , COPD   Pulmonary exam normal breath sounds clear to auscultation       Cardiovascular hypertension (147/87 preop), Pt. on medications Normal cardiovascular exam+ dysrhythmias (WPW s/p ablation)  Rhythm:Regular Rate:Normal  E last seen by cardiology 08/19/2023. Amlodipine  increased at this visit due to elevated BP. H/o aortic root dilation, stable at 4.1 cm  Echo 07/30/2022 (Care Everywhere) Technically limited study                                                             Normal LV size and systolic function. No LVH.                                         Normal RV size and systolic function.                                                 Probable bicuspid aortic valve with partial fusion between the right and left     coronary cusps. No stenosis and trivial regurgitation.                                Mild aortic dilitation at sinuses of Valsalva (4.0 cm) and ascending aorta.          NM Perfusion study 08/19/22: (Care Everywhere) IMPRESSION:  Myocardial perfusion imaging is normal. No evidence of ischemia or infarction.  Summed severity score is 7, due to attenuatoin artifact . Artifacts noted: diaphragmatic attenuation . Overall left ventricular systolic function was Normal without regional wall motion abnormalities.     Neuro/Psych  negative psych ROS   GI/Hepatic Neg liver ROS,GERD  Medicated and Controlled,,  Endo/Other  Obesity BMI 36  Renal/GU negative Renal ROS  negative  genitourinary   Musculoskeletal  (+) Arthritis , Osteoarthritis,    Abdominal  (+) + obese  Peds  Hematology Hb 17, plt 242   Anesthesia Other Findings   Reproductive/Obstetrics negative OB ROS                              Anesthesia Physical Anesthesia Plan  ASA: 3  Anesthesia Plan: Spinal, Regional and MAC   Post-op Pain Management:    Induction:   PONV Risk Score and Plan: 2 and Propofol  infusion, TIVA, Ondansetron  and Dexamethasone   Airway Management Planned: Natural Airway and Nasal Cannula  Additional Equipment: None  Intra-op Plan:   Post-operative Plan:   Informed Consent: I have reviewed the patients History and Physical, chart, labs and discussed the  procedure including the risks, benefits and alternatives for the proposed anesthesia with the patient or authorized representative who has indicated his/her understanding and acceptance.     Dental advisory given  Plan Discussed with: CRNA  Anesthesia Plan Comments: (TKR 2018 under spinal no issues )         Anesthesia Quick Evaluation

## 2023-09-29 NOTE — Progress Notes (Signed)
 Anesthesia Chart Review   Case: [id number] Date/Time: 10/08/23 0700   Procedure: ARTHROPLASTY, HIP, TOTAL, ANTERIOR APPROACH (Right: Hip)   Anesthesia type: Spinal   Diagnosis: Primary osteoarthritis of right hip [M16.11]   Pre-op diagnosis: Osteoarthritis Right Hip   Location: [facility] ROOM 09 / [facility]   Surgeons: Vernetta Lonni GRADE, MD       DISCUSSION:63 y.o. never smoker with h/o PONV, HTN, GERD, COPD, WPW s/p ablation, bicuspid aortic valve, right hip OA scheduled for above procedure 10/08/2023 with Dr. Lonni Vernetta.   Pt last seen by cardiology 08/19/2023. Amlodipine  increased at this visit due to elevated BP. H/o aortic root dilation, stable at 4/1 cm. Stress Test 08/19/2022 with no ischemia. Overall stable at this visit with 1 year follow up recommended.   Echo 07/30/2022 (Care Everywhere) Technically limited study                                                             Normal LV size and systolic function. No LVH.                                         Normal RV size and systolic function.                                                 Probable bicuspid aortic valve with partial fusion between the right and left     coronary cusps. No stenosis and trivial regurgitation.                                Mild aortic dilitation at sinuses of Valsalva (4.0 cm) and ascending aorta.          NM Perfusion study 08/19/22: (Care Everywhere) IMPRESSION:  Myocardial perfusion imaging is normal. No evidence of ischemia or infarction.  Summed severity score is 7, due to attenuatoin artifact . Artifacts noted: diaphragmatic attenuation . Overall left ventricular systolic function was Normal without regional wall motion abnormalities.  VS: BP (!) 142/86   Pulse 85   Temp 36.8 C (Oral)   Ht 6' 1 (1.854 m)   Wt 123.8 kg   SpO2 96%   BMI 36.02 kg/m   PROVIDERS: Pia Kerney SQUIBB, MD is PCP   Follows with [facility] in [place], [place]  LABS: Labs reviewed: Acceptable for  surgery. (all labs ordered are listed, but only abnormal results are displayed)  Labs Reviewed  SURGICAL PCR SCREEN - Abnormal; Notable for the following components:      Result Value   Staphylococcus aureus POSITIVE (*)    All other components within normal limits  BASIC METABOLIC PANEL WITH GFR - Abnormal; Notable for the following components:   Glucose, Bld 124 (*)    All other components within normal limits  CBC  TYPE AND SCREEN     IMAGES:   EKG:   CV: Echo 04/05/2019  1. The average left ventricular global longitudinal strain is -18.0 %.   2. Left ventricular ejection  fraction, by visual estimation, is 55 to  60%. The left ventricle has normal function. There is no left ventricular  hypertrophy.   3. The left ventricle has no regional wall motion abnormalities.   4. Left ventricular diastolic parameters are indeterminate.   5. Global right ventricle has normal systolic function.The right  ventricular size is normal. No increase in right ventricular wall  thickness.   6. Left atrial size was normal.   7. Right atrial size was normal.   8. The mitral valve is normal in structure. Trivial mitral valve  regurgitation. No evidence of mitral stenosis.   9. The tricuspid valve is normal in structure.  10. The aortic valve is bicuspid. Aortic valve regurgitation is trivial.  Mild to moderate aortic valve sclerosis/calcification without any evidence  of aortic stenosis.  11. The pulmonic valve was normal in structure. Pulmonic valve  regurgitation is not visualized.  12. Aortic dilatation noted.  13. There is mild dilatation of the aortic root and of the ascending aorta  measuring 39 mm and 40mm respectively.   Past Medical History:  Diagnosis Date   Arthritis    Bicuspid aortic valve    COPD (chronic obstructive pulmonary disease) (HCC)    Diarrhea    hx   GERD (gastroesophageal reflux disease)    History of kidney stones    History of stomach ulcers     Hypertension    no meds   PONV (postoperative nausea and vomiting)    Post-traumatic osteoarthritis of right knee 04/07/2016   Pulmonary nodules    Wart    inside my nose (04/20/2016)   WPW (Wolff-Parkinson-White syndrome)     Past Surgical History:  Procedure Laterality Date   ANTERIOR CERVICAL DECOMP/DISCECTOMY FUSION  ~ 2011   APPENDECTOMY     CARDIAC ELECTROPHYSIOLOGY MAPPING AND ABLATION  1990s   for WPW   CERVICAL SPINE SURGERY     trimmed discs   CYSTOSCOPY WITH URETEROSCOPY, STONE BASKETRY AND STENT PLACEMENT     HERNIA REPAIR     JOINT REPLACEMENT Right    KNEE ARTHROSCOPY Right 09/2014; 08/2015   LAPAROSCOPIC CHOLECYSTECTOMY     PARTIAL KNEE ARTHROPLASTY Right 04/20/2016   Procedure: KNEE ARTHROPLASY WITH  MENISCECTOMY;  Surgeon: Lamar Millman, MD;  Location: [facility] OR;  Service: Orthopedics;  Laterality: Right;   SHOULDER ARTHROSCOPY Left    TOTAL KNEE ARTHROPLASTY Right 04/20/2016   TOTAL KNEE REVISION Right 04/20/2016   Procedure: TOTAL KNEE;  Surgeon: Lamar Millman, MD;  Location: [facility] OR;  Service: Orthopedics;  Laterality: Right;    MEDICATIONS:  gabapentin (NEURONTIN) 600 MG tablet   ibuprofen (ADVIL) 800 MG tablet   losartan -hydrochlorothiazide (HYZAAR) 50-12.5 MG tablet   pantoprazole  (PROTONIX ) 40 MG tablet   sildenafil  (VIAGRA ) 50 MG tablet   spironolactone (ALDACTONE) 50 MG tablet   No current facility-administered medications for this encounter.   Harlene Hoots Ward, PA-C [facility] [phone]

## 2023-10-07 DIAGNOSIS — M1611 Unilateral primary osteoarthritis, right hip: Secondary | ICD-10-CM | POA: Insufficient documentation

## 2023-10-07 NOTE — H&P (Signed)
 TOTAL HIP ADMISSION H&P  Patient is admitted for right total hip arthroplasty.  Subjective:  Chief Complaint: right hip pain  HPI: Adam Watson, 63 y.o. male, has a history of pain and functional disability in the right hip(s) due to arthritis and patient has failed non-surgical conservative treatments for greater than 12 weeks to include NSAID's and/or analgesics, corticosteriod injections, flexibility and strengthening excercises, use of assistive devices, weight reduction as appropriate, and activity modification.  Onset of symptoms was gradual starting a few years ago with gradually worsening course since that time.The patient noted no past surgery on the right hip(s).  Patient currently rates pain in the right hip at 10 out of 10 with activity. Patient has night pain, worsening of pain with activity and weight bearing, trendelenberg gait, pain that interfers with activities of daily living, and pain with passive range of motion. Patient has evidence of subchondral sclerosis, periarticular osteophytes, and joint space narrowing by imaging studies. This condition presents safety issues increasing the risk of falls.  There is no current active infection.  Patient Active Problem List   Diagnosis Date Noted   Unilateral primary osteoarthritis, right hip 10/07/2023   Educated about COVID-19 virus infection 04/02/2019   Erectile dysfunction 04/02/2019   WPW (Wolff-Parkinson-White syndrome) 12/08/2018   Morbid obesity (HCC) 11/04/2017   Bicuspid aortic valve 10/04/2016   Post-traumatic osteoarthritis of right knee 04/07/2016   Pulmonary nodules    PONV (postoperative nausea and vomiting)    Hypertension    GERD (gastroesophageal reflux disease)    COPD (chronic obstructive pulmonary disease) (HCC)    Past Medical History:  Diagnosis Date   Arthritis    Bicuspid aortic valve    COPD (chronic obstructive pulmonary disease) (HCC)    Diarrhea    hx   GERD (gastroesophageal reflux disease)     History of kidney stones    History of stomach ulcers    Hypertension    no meds   PONV (postoperative nausea and vomiting)    Post-traumatic osteoarthritis of right knee 04/07/2016   Pulmonary nodules    Wart    inside my nose (04/20/2016)   WPW (Wolff-Parkinson-White syndrome)     Past Surgical History:  Procedure Laterality Date   ANTERIOR CERVICAL DECOMP/DISCECTOMY FUSION  ~ 2011   APPENDECTOMY     CARDIAC ELECTROPHYSIOLOGY MAPPING AND ABLATION  1990s   for WPW   CERVICAL SPINE SURGERY     trimmed discs   CYSTOSCOPY WITH URETEROSCOPY, STONE BASKETRY AND STENT PLACEMENT     HERNIA REPAIR     JOINT REPLACEMENT Right    KNEE ARTHROSCOPY Right 09/2014; 08/2015   LAPAROSCOPIC CHOLECYSTECTOMY     PARTIAL KNEE ARTHROPLASTY Right 04/20/2016   Procedure: KNEE ARTHROPLASY WITH  MENISCECTOMY;  Surgeon: Lamar Millman, MD;  Location: [facility] OR;  Service: Orthopedics;  Laterality: Right;   SHOULDER ARTHROSCOPY Left    TOTAL KNEE ARTHROPLASTY Right 04/20/2016   TOTAL KNEE REVISION Right 04/20/2016   Procedure: TOTAL KNEE;  Surgeon: Lamar Millman, MD;  Location: [facility] OR;  Service: Orthopedics;  Laterality: Right;    No current facility-administered medications for this encounter.   Current Outpatient Medications  Medication Sig Dispense Refill Last Dose/Taking   gabapentin (NEURONTIN) 600 MG tablet Take 600 mg by mouth 3 (three) times daily.   Taking   ibuprofen (ADVIL) 800 MG tablet Take 800 mg by mouth 3 (three) times daily.   Taking   losartan -hydrochlorothiazide (HYZAAR) 50-12.5 MG tablet Take 1 tablet by mouth  daily. 90 tablet 3 Taking   pantoprazole  (PROTONIX ) 40 MG tablet Take 40 mg by mouth daily.   Taking   sildenafil  (VIAGRA ) 50 MG tablet Take 1 tablet (50 mg total) by mouth daily as needed for erectile dysfunction. 10 tablet 3 Taking As Needed   spironolactone (ALDACTONE) 50 MG tablet Take 50 mg by mouth daily.   Taking   Allergies  Allergen Reactions   Codeine Other (See  Comments)    Eyes burning   Penicillins Hives and Rash   Other Nausea And Vomiting    General anesthesia    Tetracyclines & Related Other (See Comments)    Burning/Eye itching    Social History   Tobacco Use   Smoking status: Never   Smokeless tobacco: Never  Substance Use Topics   Alcohol use: No    Family History  Problem Relation Age of Onset   Hypertension Mother    Lupus Mother    Kidney failure Mother    Cancer Father        throat and lungs   Hypertension Brother    Heart attack Brother    Diverticulitis Brother      Review of Systems  Objective:  Physical Exam Vitals reviewed.  Constitutional:      Appearance: Normal appearance. He is obese.  HENT:     Head: Normocephalic and atraumatic.  Eyes:     Extraocular Movements: Extraocular movements intact.     Pupils: Pupils are equal, round, and reactive to light.  Cardiovascular:     Rate and Rhythm: Normal rate.  Pulmonary:     Effort: Pulmonary effort is normal.     Breath sounds: Normal breath sounds.  Abdominal:     Palpations: Abdomen is soft.  Musculoskeletal:     Cervical back: Normal range of motion and neck supple.     Right hip: Tenderness and bony tenderness present. Decreased range of motion. Decreased strength.  Neurological:     Mental Status: He is alert and oriented to person, place, and time.  Psychiatric:        Behavior: Behavior normal.     Vital signs in last 24 hours:    Labs:   Estimated body mass index is 36.02 kg/m as calculated from the following:   Height as of 09/28/23: 6' 1 (1.854 m).   Weight as of 09/28/23: 123.8 kg.   Imaging Review Plain radiographs demonstrate severe degenerative joint disease of the right hip(s). The bone quality appears to be good for age and reported activity level.      Assessment/Plan:  End stage arthritis, right hip(s)  The patient history, physical examination, clinical judgement of the provider and imaging studies are  consistent with end stage degenerative joint disease of the right hip(s) and total hip arthroplasty is deemed medically necessary. The treatment options including medical management, injection therapy, arthroscopy and arthroplasty were discussed at length. The risks and benefits of total hip arthroplasty were presented and reviewed. The risks due to aseptic loosening, infection, stiffness, dislocation/subluxation,  thromboembolic complications and other imponderables were discussed.  The patient acknowledged the explanation, agreed to proceed with the plan and consent was signed. Patient is being admitted for inpatient treatment for surgery, pain control, PT, OT, prophylactic antibiotics, VTE prophylaxis, progressive ambulation and ADL's and discharge planning.The patient is planning to be discharged home with home health services

## 2023-10-08 ENCOUNTER — Ambulatory Visit (HOSPITAL_COMMUNITY): Payer: Self-pay | Admitting: Physician Assistant

## 2023-10-08 ENCOUNTER — Observation Stay (HOSPITAL_COMMUNITY)
Admission: RE | Admit: 2023-10-08 | Discharge: 2023-10-09 | Disposition: A | Discharge status: Home Health | Source: Ambulatory Visit | Attending: Orthopaedic Surgery | Admitting: Orthopaedic Surgery

## 2023-10-08 ENCOUNTER — Ambulatory Visit (HOSPITAL_BASED_OUTPATIENT_CLINIC_OR_DEPARTMENT_OTHER): Payer: Self-pay | Admitting: Anesthesiology

## 2023-10-08 ENCOUNTER — Other Ambulatory Visit: Payer: Self-pay

## 2023-10-08 ENCOUNTER — Observation Stay (HOSPITAL_COMMUNITY)

## 2023-10-08 ENCOUNTER — Ambulatory Visit (HOSPITAL_COMMUNITY)

## 2023-10-08 ENCOUNTER — Encounter (HOSPITAL_COMMUNITY)
Admission: RE | Disposition: A | Discharge status: Home Health | Payer: Self-pay | Source: Ambulatory Visit | Attending: Orthopaedic Surgery

## 2023-10-08 ENCOUNTER — Encounter (HOSPITAL_COMMUNITY): Payer: Self-pay | Admitting: Orthopaedic Surgery

## 2023-10-08 DIAGNOSIS — M1611 Unilateral primary osteoarthritis, right hip: Principal | ICD-10-CM | POA: Insufficient documentation

## 2023-10-08 DIAGNOSIS — J449 Chronic obstructive pulmonary disease, unspecified: Secondary | ICD-10-CM | POA: Insufficient documentation

## 2023-10-08 DIAGNOSIS — Z96651 Presence of right artificial knee joint: Secondary | ICD-10-CM | POA: Insufficient documentation

## 2023-10-08 DIAGNOSIS — I10 Essential (primary) hypertension: Secondary | ICD-10-CM | POA: Diagnosis not present

## 2023-10-08 DIAGNOSIS — Z6836 Body mass index (BMI) 36.0-36.9, adult: Secondary | ICD-10-CM | POA: Diagnosis not present

## 2023-10-08 DIAGNOSIS — Z96641 Presence of right artificial hip joint: Secondary | ICD-10-CM

## 2023-10-08 DIAGNOSIS — Z79899 Other long term (current) drug therapy: Secondary | ICD-10-CM | POA: Diagnosis not present

## 2023-10-08 HISTORY — PX: TOTAL HIP ARTHROPLASTY: SHX124

## 2023-10-08 LAB — TYPE AND SCREEN
ABO/RH(D): A POS
Antibody Screen: NEGATIVE

## 2023-10-08 LAB — ABO/RH: ABO/RH(D): A POS

## 2023-10-08 SURGERY — ARTHROPLASTY, HIP, TOTAL, ANTERIOR APPROACH
Anesthesia: Monitor Anesthesia Care | Site: Hip | Laterality: Right

## 2023-10-08 MED ORDER — HYDROCHLOROTHIAZIDE 12.5 MG PO TABS
12.5000 mg | ORAL_TABLET | Freq: Every day | ORAL | Status: DC
  Administered 2023-10-08 – 2023-10-09 (×2): 12.5 mg via ORAL
  Filled 2023-10-08 (×2): qty 1

## 2023-10-08 MED ORDER — GABAPENTIN 300 MG PO CAPS
600.0000 mg | ORAL_CAPSULE | Freq: Three times a day (TID) | ORAL | Status: DC
  Administered 2023-10-08 – 2023-10-09 (×3): 600 mg via ORAL
  Filled 2023-10-08 (×3): qty 2

## 2023-10-08 MED ORDER — HYDROMORPHONE HCL 1 MG/ML IJ SOLN: 0.5000 mg | INTRAMUSCULAR | Status: DC | PRN

## 2023-10-08 MED ORDER — PHENYLEPHRINE HCL-NACL 20-0.9 MG/250ML-% IV SOLN
INTRAVENOUS | Status: DC | PRN
  Administered 2023-10-08: 15 ug/min via INTRAVENOUS

## 2023-10-08 MED ORDER — DEXMEDETOMIDINE HCL IN NACL 80 MCG/20ML IV SOLN
INTRAVENOUS | Status: AC
  Filled 2023-10-08: qty 20

## 2023-10-08 MED ORDER — ONDANSETRON HCL 4 MG/2ML IJ SOLN
INTRAMUSCULAR | Status: AC
  Filled 2023-10-08: qty 2

## 2023-10-08 MED ORDER — PROPOFOL 1000 MG/100ML IV EMUL
INTRAVENOUS | Status: AC
  Filled 2023-10-08: qty 100

## 2023-10-08 MED ORDER — BUPIVACAINE IN DEXTROSE 0.75-8.25 % IT SOLN
INTRATHECAL | Status: DC | PRN
Start: 2023-10-08 — End: 2023-10-08
  Administered 2023-10-08: 1.8 mL via INTRATHECAL

## 2023-10-08 MED ORDER — DEXAMETHASONE SODIUM PHOSPHATE 10 MG/ML IJ SOLN
INTRAMUSCULAR | Status: DC | PRN
  Administered 2023-10-08: 10 mg via INTRAVENOUS

## 2023-10-08 MED ORDER — ONDANSETRON HCL 4 MG PO TABS: 4.0000 mg | ORAL_TABLET | Freq: Four times a day (QID) | ORAL | Status: DC | PRN

## 2023-10-08 MED ORDER — CHLORHEXIDINE GLUCONATE 0.12 % MT SOLN
15.0000 mL | Freq: Once | OROMUCOSAL | Status: AC
  Administered 2023-10-08: 15 mL via OROMUCOSAL

## 2023-10-08 MED ORDER — ACETAMINOPHEN 325 MG PO TABS
325.0000 mg | ORAL_TABLET | Freq: Four times a day (QID) | ORAL | Status: DC | PRN
  Administered 2023-10-09: 650 mg via ORAL
  Filled 2023-10-08: qty 2

## 2023-10-08 MED ORDER — SODIUM CHLORIDE 0.9 % IR SOLN
Status: DC | PRN
  Administered 2023-10-08: 1000 mL

## 2023-10-08 MED ORDER — ALUM & MAG HYDROXIDE-SIMETH 200-200-20 MG/5ML PO SUSP
30.0000 mL | ORAL | Status: DC | PRN
Start: 2023-10-08 — End: 2023-10-09

## 2023-10-08 MED ORDER — OXYCODONE HCL 5 MG PO TABS: 5.0000 mg | ORAL_TABLET | Freq: Once | ORAL | Status: DC | PRN

## 2023-10-08 MED ORDER — LOSARTAN POTASSIUM-HCTZ 50-12.5 MG PO TABS: 1.0000 | ORAL_TABLET | Freq: Every day | ORAL | Status: DC

## 2023-10-08 MED ORDER — OXYCODONE HCL 5 MG PO TABS
5.0000 mg | ORAL_TABLET | ORAL | Status: DC | PRN
  Administered 2023-10-08 – 2023-10-09 (×5): 10 mg via ORAL
  Filled 2023-10-08 (×6): qty 2

## 2023-10-08 MED ORDER — METOCLOPRAMIDE HCL 5 MG/ML IJ SOLN: 5.0000 mg | Freq: Three times a day (TID) | INTRAMUSCULAR | Status: DC | PRN

## 2023-10-08 MED ORDER — MENTHOL 3 MG MT LOZG: 1.0000 | LOZENGE | OROMUCOSAL | Status: DC | PRN

## 2023-10-08 MED ORDER — ORAL CARE MOUTH RINSE: 15.0000 mL | OROMUCOSAL | Status: DC | PRN

## 2023-10-08 MED ORDER — FENTANYL CITRATE (PF) 100 MCG/2ML IJ SOLN
INTRAMUSCULAR | Status: DC | PRN
  Administered 2023-10-08: 25 ug via INTRAVENOUS

## 2023-10-08 MED ORDER — ASPIRIN 81 MG PO CHEW
81.0000 mg | CHEWABLE_TABLET | Freq: Two times a day (BID) | ORAL | Status: DC
  Administered 2023-10-08 – 2023-10-09 (×2): 81 mg via ORAL
  Filled 2023-10-08 (×2): qty 1

## 2023-10-08 MED ORDER — METHOCARBAMOL 500 MG PO TABS
500.0000 mg | ORAL_TABLET | Freq: Four times a day (QID) | ORAL | Status: DC | PRN
  Administered 2023-10-08 – 2023-10-09 (×3): 500 mg via ORAL
  Filled 2023-10-08 (×3): qty 1

## 2023-10-08 MED ORDER — SPIRONOLACTONE 25 MG PO TABS: 50.0000 mg | ORAL_TABLET | Freq: Every day | ORAL | Status: DC

## 2023-10-08 MED ORDER — PROPOFOL 10 MG/ML IV BOLUS
INTRAVENOUS | Status: AC
  Filled 2023-10-08: qty 20

## 2023-10-08 MED ORDER — TRANEXAMIC ACID-NACL 1000-0.7 MG/100ML-% IV SOLN
1000.0000 mg | INTRAVENOUS | Status: AC
  Administered 2023-10-08: 1000 mg via INTRAVENOUS
  Filled 2023-10-08: qty 100

## 2023-10-08 MED ORDER — ONDANSETRON HCL 4 MG/2ML IJ SOLN: 4.0000 mg | Freq: Once | INTRAMUSCULAR | Status: DC | PRN

## 2023-10-08 MED ORDER — PANTOPRAZOLE SODIUM 40 MG PO TBEC
40.0000 mg | DELAYED_RELEASE_TABLET | Freq: Every day | ORAL | Status: DC
  Administered 2023-10-09: 40 mg via ORAL
  Filled 2023-10-08: qty 1

## 2023-10-08 MED ORDER — OXYCODONE HCL 5 MG PO TABS
10.0000 mg | ORAL_TABLET | ORAL | Status: DC | PRN
  Administered 2023-10-08: 10 mg via ORAL

## 2023-10-08 MED ORDER — METHOCARBAMOL 1000 MG/10ML IJ SOLN: 500.0000 mg | Freq: Four times a day (QID) | INTRAMUSCULAR | Status: DC | PRN

## 2023-10-08 MED ORDER — ORAL CARE MOUTH RINSE: 15.0000 mL | Freq: Once | OROMUCOSAL | Status: AC

## 2023-10-08 MED ORDER — MUPIROCIN 2 % EX OINT: 1.0000 | TOPICAL_OINTMENT | Freq: Two times a day (BID) | CUTANEOUS | 0 refills | Status: DC

## 2023-10-08 MED ORDER — CEFAZOLIN SODIUM-DEXTROSE 2-4 GM/100ML-% IV SOLN
2.0000 g | Freq: Four times a day (QID) | INTRAVENOUS | Status: AC
  Administered 2023-10-08 (×2): 2 g via INTRAVENOUS
  Filled 2023-10-08 (×2): qty 100

## 2023-10-08 MED ORDER — PHENOL 1.4 % MT LIQD: 1.0000 | OROMUCOSAL | Status: DC | PRN

## 2023-10-08 MED ORDER — FENTANYL CITRATE (PF) 100 MCG/2ML IJ SOLN
INTRAMUSCULAR | Status: AC
  Filled 2023-10-08: qty 2

## 2023-10-08 MED ORDER — SPIRONOLACTONE 25 MG PO TABS
50.0000 mg | ORAL_TABLET | Freq: Every day | ORAL | Status: DC
  Administered 2023-10-08: 50 mg via ORAL
  Filled 2023-10-08: qty 2

## 2023-10-08 MED ORDER — LACTATED RINGERS IV SOLN: INTRAVENOUS | Status: DC

## 2023-10-08 MED ORDER — METOCLOPRAMIDE HCL 5 MG PO TABS: 5.0000 mg | ORAL_TABLET | Freq: Three times a day (TID) | ORAL | Status: DC | PRN

## 2023-10-08 MED ORDER — ONDANSETRON HCL 4 MG/2ML IJ SOLN: 4.0000 mg | Freq: Four times a day (QID) | INTRAMUSCULAR | Status: DC | PRN

## 2023-10-08 MED ORDER — 0.9 % SODIUM CHLORIDE (POUR BTL) OPTIME
TOPICAL | Status: DC | PRN
  Administered 2023-10-08: 1000 mL

## 2023-10-08 MED ORDER — POVIDONE-IODINE 10 % EX SWAB
2.0000 | Freq: Once | CUTANEOUS | Status: AC
  Administered 2023-10-08: 2 via TOPICAL

## 2023-10-08 MED ORDER — SODIUM CHLORIDE 0.9 % IV SOLN: INTRAVENOUS | Status: DC

## 2023-10-08 MED ORDER — DEXAMETHASONE SODIUM PHOSPHATE 10 MG/ML IJ SOLN
INTRAMUSCULAR | Status: AC
  Filled 2023-10-08: qty 1

## 2023-10-08 MED ORDER — DOCUSATE SODIUM 100 MG PO CAPS
100.0000 mg | ORAL_CAPSULE | Freq: Two times a day (BID) | ORAL | Status: DC
  Administered 2023-10-08 – 2023-10-09 (×3): 100 mg via ORAL
  Filled 2023-10-08 (×3): qty 1

## 2023-10-08 MED ORDER — ACETAMINOPHEN 500 MG PO TABS
1000.0000 mg | ORAL_TABLET | Freq: Once | ORAL | Status: AC
  Administered 2023-10-08: 1000 mg via ORAL
  Filled 2023-10-08: qty 2

## 2023-10-08 MED ORDER — PROPOFOL 500 MG/50ML IV EMUL
INTRAVENOUS | Status: DC | PRN
  Administered 2023-10-08: 30 mg via INTRAVENOUS
  Administered 2023-10-08: 80 ug/kg/min via INTRAVENOUS

## 2023-10-08 MED ORDER — AMISULPRIDE (ANTIEMETIC) 5 MG/2ML IV SOLN: 10.0000 mg | Freq: Once | INTRAVENOUS | Status: DC | PRN

## 2023-10-08 MED ORDER — HYDROMORPHONE HCL 1 MG/ML IJ SOLN: 0.2500 mg | INTRAMUSCULAR | Status: DC | PRN

## 2023-10-08 MED ORDER — OXYCODONE HCL 5 MG/5ML PO SOLN: 5.0000 mg | Freq: Once | ORAL | Status: DC | PRN

## 2023-10-08 MED ORDER — MIDAZOLAM HCL 5 MG/5ML IJ SOLN
INTRAMUSCULAR | Status: DC | PRN
  Administered 2023-10-08: 2 mg via INTRAVENOUS

## 2023-10-08 MED ORDER — MIDAZOLAM HCL 2 MG/2ML IJ SOLN
INTRAMUSCULAR | Status: AC
  Filled 2023-10-08: qty 2

## 2023-10-08 MED ORDER — ONDANSETRON HCL 4 MG/2ML IJ SOLN
INTRAMUSCULAR | Status: DC | PRN
Start: 2023-10-08 — End: 2023-10-08
  Administered 2023-10-08: 4 mg via INTRAVENOUS

## 2023-10-08 MED ORDER — CHLORHEXIDINE GLUCONATE 4 % EX SOLN: 1.0000 | CUTANEOUS | 1 refills | Status: DC

## 2023-10-08 MED ORDER — DIPHENHYDRAMINE HCL 12.5 MG/5ML PO ELIX: 12.5000 mg | ORAL_SOLUTION | ORAL | Status: DC | PRN

## 2023-10-08 MED ORDER — CEFAZOLIN SODIUM-DEXTROSE 3-4 GM/150ML-% IV SOLN
3.0000 g | INTRAVENOUS | Status: AC
  Administered 2023-10-08: 2 g via INTRAVENOUS
  Filled 2023-10-08: qty 150

## 2023-10-08 MED ORDER — LOSARTAN POTASSIUM 50 MG PO TABS
50.0000 mg | ORAL_TABLET | Freq: Every day | ORAL | Status: DC
  Administered 2023-10-08 – 2023-10-09 (×2): 50 mg via ORAL
  Filled 2023-10-08 (×2): qty 1

## 2023-10-08 SURGICAL SUPPLY — 37 items
BAG COUNTER SPONGE SURGICOUNT (BAG) ×2 IMPLANT
BAG ZIPLOCK 12X15 (MISCELLANEOUS) IMPLANT
BENZOIN TINCTURE PRP APPL 2/3 (GAUZE/BANDAGES/DRESSINGS) IMPLANT
BLADE SAW SGTL 18X1.27X75 (BLADE) ×2 IMPLANT
COVER PERINEAL POST (MISCELLANEOUS) ×2 IMPLANT
COVER SURGICAL LIGHT HANDLE (MISCELLANEOUS) ×2 IMPLANT
CUP ACET PNNCL SECTR W/GRIP 56 (Hips) IMPLANT
DRAPE FOOT SWITCH (DRAPES) ×2 IMPLANT
DRAPE STERI IOBAN 125X83 (DRAPES) ×2 IMPLANT
DRAPE U-SHAPE 47X51 STRL (DRAPES) ×4 IMPLANT
DRSG AQUACEL AG ADV 3.5X10 (GAUZE/BANDAGES/DRESSINGS) ×2 IMPLANT
DURAPREP 26ML APPLICATOR (WOUND CARE) ×2 IMPLANT
ELECT PENCIL ROCKER SW 15FT (MISCELLANEOUS) ×2 IMPLANT
ELECT REM PT RETURN 15FT ADLT (MISCELLANEOUS) ×2 IMPLANT
GAUZE XEROFORM 1X8 LF (GAUZE/BANDAGES/DRESSINGS) IMPLANT
GLOVE BIO SURGEON STRL SZ7.5 (GLOVE) ×2 IMPLANT
GLOVE BIOGEL PI IND STRL 8 (GLOVE) ×4 IMPLANT
GLOVE ECLIPSE 8.0 STRL XLNG CF (GLOVE) ×2 IMPLANT
GOWN STRL REUS W/ TWL XL LVL3 (GOWN DISPOSABLE) ×4 IMPLANT
HEAD CERAMIC 36 PLUS5 (Hips) IMPLANT
HOLDER FOLEY CATH W/STRAP (MISCELLANEOUS) ×2 IMPLANT
KIT TURNOVER KIT A (KITS) ×2 IMPLANT
PACK ANTERIOR HIP CUSTOM (KITS) ×2 IMPLANT
PENCIL SMOKE EVACUATOR (MISCELLANEOUS) IMPLANT
PINNACLE ALTRX PLUS 4 N 36X56 (Hips) IMPLANT
SET HNDPC FAN SPRY TIP SCT (DISPOSABLE) ×2 IMPLANT
STAPLER SKIN PROX 35W (STAPLE) IMPLANT
STEM FEMORAL SZ5 HIGH ACTIS (Stem) IMPLANT
STRIP CLOSURE SKIN 1/2X4 (GAUZE/BANDAGES/DRESSINGS) IMPLANT
SUT ETHIBOND NAB CT1 #1 30IN (SUTURE) ×2 IMPLANT
SUT ETHILON 2 0 PS N (SUTURE) IMPLANT
SUT MNCRL AB 4-0 PS2 18 (SUTURE) IMPLANT
SUT VIC AB 0 CT1 36 (SUTURE) ×2 IMPLANT
SUT VIC AB 1 CT1 36 (SUTURE) ×2 IMPLANT
SUT VIC AB 2-0 CT1 TAPERPNT 27 (SUTURE) ×4 IMPLANT
TRAY FOLEY MTR SLVR 16FR STAT (SET/KITS/TRAYS/PACK) IMPLANT
YANKAUER SUCT BULB TIP NO VENT (SUCTIONS) ×2 IMPLANT

## 2023-10-08 NOTE — Interval H&P Note (Signed)
 History and Physical Interval Note:   The patient understands that he is here today for a right total hip replacement to treat his significant right hip arthritis and pain.  There has been no acute or interval change in his medical status.  The risks and benefits of surgery have been discussed in detail and informed consent has been obtained.  The right operative hip has been marked.  10/08/2023 6:54 AM  Adam Watson  has presented today for surgery, with the diagnosis of Osteoarthritis Right Hip.  The various methods of treatment have been discussed with the patient and family. After consideration of risks, benefits and other options for treatment, the patient has consented to  Procedure(s): ARTHROPLASTY, HIP, TOTAL, ANTERIOR APPROACH (Right) as a surgical intervention.  The patient's history has been reviewed, patient examined, no change in status, stable for surgery.  I have reviewed the patient's chart and labs.  Questions were answered to the patient's satisfaction.     Adam Watson

## 2023-10-08 NOTE — Transfer of Care (Signed)
 Immediate Anesthesia Transfer of Care Note  Patient: Adam Watson  Procedure(s) Performed: ARTHROPLASTY, HIP, TOTAL, ANTERIOR APPROACH (Right: Hip)  Patient Location: PACU  Anesthesia Type:MAC and Spinal  Level of Consciousness: sedated, patient cooperative, and responds to stimulation  Airway & Oxygen Therapy: Patient Spontanous Breathing and Patient connected to face mask oxygen  Post-op Assessment: Report given to RN and Post -op Vital signs reviewed and stable  Post vital signs: Reviewed and stable  Last Vitals:  Vitals Value Taken Time  BP 95/62 10/08/23 08:46  Temp    Pulse 79 10/08/23 08:47  Resp 13 10/08/23 08:47  SpO2 96 % 10/08/23 08:47  Vitals shown include unfiled device data.  Last Pain:  Vitals:   10/08/23 0622  TempSrc: Oral  PainSc:          Complications: No notable events documented.

## 2023-10-08 NOTE — Plan of Care (Signed)
  Problem: Health Behavior/Discharge Planning: Goal: Ability to manage health-related needs will improve Outcome: Progressing   Problem: Clinical Measurements: Goal: Ability to maintain clinical measurements within normal limits will improve Outcome: Progressing Goal: Will remain free from infection Outcome: Progressing Goal: Diagnostic test results will improve Outcome: Progressing Goal: Respiratory complications will improve Outcome: Progressing Goal: Cardiovascular complication will be avoided Outcome: Progressing   Problem: Activity: Goal: Risk for activity intolerance will decrease Outcome: Adequate for Discharge   Problem: Nutrition: Goal: Adequate nutrition will be maintained Outcome: Completed/Met   Problem: Coping: Goal: Level of anxiety will decrease Outcome: Progressing   Problem: Elimination: Goal: Will not experience complications related to bowel motility Outcome: Progressing Goal: Will not experience complications related to urinary retention Outcome: Progressing   Problem: Pain Managment: Goal: General experience of comfort will improve and/or be controlled Outcome: Progressing   Problem: Safety: Goal: Ability to remain free from injury will improve Outcome: Progressing   Problem: Skin Integrity: Goal: Risk for impaired skin integrity will decrease Outcome: Progressing   Problem: Education: Goal: Knowledge of the prescribed therapeutic regimen will improve Outcome: Progressing Goal: Understanding of discharge needs will improve Outcome: Progressing   Problem: Activity: Goal: Ability to avoid complications of mobility impairment will improve Outcome: Progressing Goal: Ability to tolerate increased activity will improve Outcome: Progressing   Problem: Clinical Measurements: Goal: Postoperative complications will be avoided or minimized Outcome: Progressing   Problem: Pain Management: Goal: Pain level will decrease with appropriate  interventions Outcome: Progressing   Problem: Skin Integrity: Goal: Will show signs of wound healing Outcome: Progressing

## 2023-10-08 NOTE — Anesthesia Procedure Notes (Signed)
 Procedure Name: MAC Date/Time: 10/08/2023 7:15 AM  Performed by: Nada Corean CROME, CRNAPre-anesthesia Checklist: Patient identified, Emergency Drugs available, Suction available, Patient being monitored and Timeout performed Patient Re-evaluated:Patient Re-evaluated prior to induction Oxygen Delivery Method: Simple face mask Preoxygenation: Pre-oxygenation with 100% oxygen Induction Type: IV induction Airway Equipment and Method: Oral airway Placement Confirmation: positive ETCO2 Dental Injury: Teeth and Oropharynx as per pre-operative assessment

## 2023-10-08 NOTE — Op Note (Signed)
 Operative Note  Date of operation: 10/08/2023 Preoperative diagnosis: Right hip primary osteoarthritis Postoperative diagnosis: Same  Procedure: Right direct anterior total hip arthroplasty  Implants: Implant Name Type Inv. Item Serial No. Manufacturer Lot No. LRB No. Used Action  CUP ACET PNNCL SECTR W/GRIP 56 - [id number] Hips CUP ACET PNNCL SECTR W/GRIP 56  [employer] [id number] Right 1 Implanted  PINNACLE ALTRX PLUS 4 N 36X56 - [id number] Hips PINNACLE ALTRX PLUS 4 N 36X56  [employer] [id number] Right 1 Implanted  STEM FEMORAL SZ5 HIGH ACTIS - [id number] Stem STEM FEMORAL SZ5 HIGH ACTIS  [employer] [id number] Right 1 Implanted  HEAD CERAMIC 36 PLUS5 - [id number] Hips HEAD CERAMIC 36 PLUS5  [employer] [id number] Right 1 Implanted   Surgeon: Lonni GRADE. Vernetta, MD Assistant: Tory Gaskins, PA-C  Anesthesia: Spinal Antibiotics: IV Ancef EBL: 350 cc Complications: None  Indications: The patient is a 63 year old gentleman with debilitating arthritis involving his right hip this been well-documented with clinical exam and x-ray findings.  His x-rays show bone-on-bone wear with impaction of the femoral head on the acetabulum.  He actually has significant arthritis in his left hip but does not have a lot of symptoms of the left hip.  At this point his right hip pain is daily.  He walks with a limp.  It is detrimentally affecting his mobility, his quality of life and his activities day living to the point he does wish to proceed with hip replacement and we agree with this as well.  We did discuss in length in detail the risks of acute blood loss anemia, nerve vessel injury, fracture, infection, DVT, implant failure, dislocation, leg length differences and wound healing issues.  He understands that our goals are hopefully decreased pain, improved mobility and improve quality of life.  Procedure description: After informed consent was obtained and the appropriate right hip  was marked, the patient was brought to the operating room and set up on the stretcher where spinal anesthesia was obtained.  He was laid in supine position on the stretcher and a Foley catheter was placed.  Next traction boots were placed on both his feet and he was placed supine on the Hana fracture table with a perineal post in place and both legs in inline skeletal traction devices but no traction applied.  His right operative hip and pelvis were assessed radiographically.  The right hip was prepped and draped with DuraPrep and sterile drapes.  A timeout was called and he was identified as the correct patient and the correct right hip.  An incision was then made just inferior and posterior to the ASIS and carried slightly obliquely down the leg.  Dissection was carried down to the tensor fascia lata muscle and the tensor fascia was then divided longitudinally to proceed with a direct anterior approach to the hip.  Circumflex vessels were identified and cauterized.  The hip capsule was identified and opened up in L-type format finding a moderate joint effusion.  Cobra retractors were placed around the medial and lateral femoral neck and a femoral neck cut was made with the oscillating saw just proximal to the lesser trochanter and this cut was completed with an osteotome.  A corkscrew guide was placed in the femoral head and the femoral head was removed in its entirety and there was a wide area devoid of cartilage.  A bent Hohmann was then placed over the medial acetabular rim and remnants of the acetabular labrum and other debris were removed.  Reaming  was then initiated from a size 43 reamer and so present was noted to size 55 reamer with all reamers placed under direct visualization and the last reamer also placed under direct fluoroscopy in order to obtain the depth and reaming, the inclination and the anteversion.  The real DePuy Sectra GRIPTION acetabular component was then placed size 56 without difficulty.   We then went with a 36+4 polyethylene liner.  Attention was then turned to the femur.  With the right leg externally rotated to 120 degrees, extended and adducted, a Mueller retractors placed medially and Hohmann retractor behind the greater trochanter.  The lateral joint capsule was released and a box cutting osteotome was used into the femoral canal.  Broaching was then initiated using the Actis broaching system from a size 0 going up to a size 5.  With a size 5 in place we trialed a high offset femoral neck based on his anatomy and a 36+1.5 trial head ball.  We brought the right leg over and up and with traction and internal rotation reduced in the pelvis.  Based on clinical and radiographic assessment we felt like the stem was a good fit but we needed more leg length.  We dislocated the hip remove the trial components.  We then placed the real Actis femoral component with high offset size 5 and with the real 36+5 ceramic head ball.  Again this reduced and pelvis and complains of range of motion and offset as well as stability assessed clinically and radiographically.  The soft tissue was then irrigated with normal saline solution.  The joint capsule was closed with interrupted #1 Ethibond suture followed by #1 Vicryl to close the tensor fascia.  0 Vicryl was used to close the deep tissue and 2-0 Vicryl was used to close subcutaneous tissue.  The skin was closed with staples.  An Aquacel dressing was applied.  The patient was taken off the Hana table and taken the recovery room.  Tory Gaskins, PA-C did assist during the entire case and beginning the end and his assistance was crucial and medically necessary for soft tissue management and retraction, helping guide implant placement and a layered closure of the wound.

## 2023-10-08 NOTE — Evaluation (Signed)
 Physical Therapy Evaluation Patient Details Name: Adam Watson MRN: [id number] DOB: 1961/03/17 Today's Date: 10/08/2023  History of Present Illness  Pt s/p R THR and with hx of Wolff-Parkinson-White Syndrome, R TKR and cervical decompression.  Clinical Impression  Pt s/p R THR and presents with decreased R LE strength/ROM and post op pain limiting functional mobility.  Pt should progress well to dc home with family assist.        If plan is discharge home, recommend the following: A little help with walking and/or transfers;A little help with bathing/dressing/bathroom;Assistance with cooking/housework;Assist for transportation;Help with stairs or ramp for entrance   Can travel by private vehicle        Equipment Recommendations Rolling walker (2 wheels)  Recommendations for Other Services       Functional Status Assessment Patient has not had a recent decline in their functional status     Precautions / Restrictions Precautions Precautions: Fall Restrictions Weight Bearing Restrictions Per Provider Order: Yes RLE Weight Bearing Per Provider Order: Weight bearing as tolerated      Mobility  Bed Mobility Overal bed mobility: Needs Assistance Bed Mobility: Supine to Sit, Sit to Supine     Supine to sit: Min assist Sit to supine: Min assist   General bed mobility comments: cues for sequence and use of L LE to self assist    Transfers Overall transfer level: Needs assistance Equipment used: Rolling walker (2 wheels) Transfers: Sit to/from Stand Sit to Stand: Min assist, From elevated surface           General transfer comment: cues for LE management and use of UEs to self assist    Ambulation/Gait Ambulation/Gait assistance: Min assist Gait Distance (Feet): 75 Feet Assistive device: Rolling walker (2 wheels) Gait Pattern/deviations: Step-to pattern, Decreased step length - right, Decreased step length - left, Shuffle, Trunk flexed Gait velocity: decr      General Gait Details: cues for sequence, posture and position from [employer]            Wheelchair Mobility     Tilt Bed    Modified Rankin (Stroke Patients Only)       Balance Overall balance assessment: Needs assistance Sitting-balance support: No upper extremity supported, Feet supported Sitting balance-Leahy Scale: Good     Standing balance support: Bilateral upper extremity supported Standing balance-Leahy Scale: Poor                               Pertinent Vitals/Pain Pain Assessment Pain Assessment: 0-10 Pain Score: 4  Pain Location: R hip Pain Descriptors / Indicators: Aching, Sore Pain Intervention(s): Limited activity within patient's tolerance, Monitored during session, Premedicated before session, Ice applied    Home Living Family/patient expects to be discharged to:: Private residence Living Arrangements: Spouse/significant other Available Help at Discharge: Family;Available 24 hours/day Type of Home: House Home Access: Stairs to enter Entrance Stairs-Rails: Left Entrance Stairs-Number of Steps: 3   Home Layout: One level Home Equipment: Cane - single point      Prior Function Prior Level of Function : Independent/Modified Independent                     Extremity/Trunk Assessment   Upper Extremity Assessment Upper Extremity Assessment: Overall WFL for tasks assessed    Lower Extremity Assessment Lower Extremity Assessment: RLE deficits/detail    Cervical / Trunk Assessment Cervical / Trunk Assessment: Normal  Communication  Communication Communication: No apparent difficulties    Cognition Arousal: Alert Behavior During Therapy: WFL for tasks assessed/performed   PT - Cognitive impairments: No apparent impairments                         Following commands: Intact       Cueing Cueing Techniques: Verbal cues     General Comments      Exercises Total Joint Exercises Ankle  Circles/Pumps: AROM, Both, 15 reps, Supine   Assessment/Plan    PT Assessment Patient needs continued PT services  PT Problem List Decreased strength;Decreased range of motion;Decreased activity tolerance;Decreased balance;Decreased mobility;Decreased knowledge of use of DME;Pain       PT Treatment Interventions DME instruction;Gait training;Stair training;Functional mobility training;Therapeutic activities;Therapeutic exercise;Patient/family education    PT Goals (Current goals can be found in the Care Plan section)  Acute Rehab PT Goals Patient Stated Goal: Regain IND PT Goal Formulation: With patient Time For Goal Achievement: 10/15/23 Potential to Achieve Goals: Good    Frequency 7X/week     Co-evaluation               AM-PAC PT 6 Clicks Mobility  Outcome Measure Help needed turning from your back to your side while in a flat bed without using bedrails?: A Little Help needed moving from lying on your back to sitting on the side of a flat bed without using bedrails?: A Little Help needed moving to and from a bed to a chair (including a wheelchair)?: A Little Help needed standing up from a chair using your arms (e.g., wheelchair or bedside chair)?: A Little Help needed to walk in hospital room?: A Little Help needed climbing 3-5 steps with a railing? : A Lot 6 Click Score: 17    End of Session Equipment Utilized During Treatment: Gait belt Activity Tolerance: Patient tolerated treatment well Patient left: in bed;with call bell/phone within reach;with bed alarm set Nurse Communication: Mobility status PT Visit Diagnosis: Difficulty in walking, not elsewhere classified (R26.2)    Time: 1433-1510 PT Time Calculation (min) (ACUTE ONLY): 37 min   Charges:   PT Evaluation $PT Eval Low Complexity: 1 Low PT Treatments $Gait Training: 8-22 mins PT General Charges $$ ACUTE PT VISIT: 1 Visit         [facility] PT Acute Rehabilitation Services Office  [phone]   Adam Watson 10/08/2023, 3:22 PM

## 2023-10-08 NOTE — Anesthesia Procedure Notes (Signed)
 Spinal  Patient location during procedure: OR Start time: 10/08/2023 7:13 AM End time: 10/08/2023 7:15 AM Reason for block: surgical anesthesia Staffing Performed: anesthesiologist  Anesthesiologist: Merla Almarie HERO, DO Performed by: Merla Almarie HERO, DO Authorized by: Merla Almarie HERO, DO   Preanesthetic Checklist Completed: patient identified, IV checked, risks and benefits discussed, surgical consent, monitors and equipment checked, pre-op evaluation and timeout performed Spinal Block Patient position: sitting Prep: DuraPrep and site prepped and draped Patient monitoring: cardiac monitor, continuous pulse ox and blood pressure Approach: midline Location: L3-4 Injection technique: single-shot Needle Needle type: Pencan  Needle gauge: 24 G Needle length: 9 cm Assessment Sensory level: T6 Events: CSF return Additional Notes Functioning IV was confirmed and monitors were applied. Sterile prep and drape, including hand hygiene and sterile gloves were used. The patient was positioned and the spine was prepped. The skin was anesthetized with lidocaine .  Free flow of clear CSF was obtained prior to injecting local anesthetic into the CSF.  The spinal needle aspirated freely following injection.  The needle was carefully withdrawn.  The patient tolerated the procedure well.

## 2023-10-08 NOTE — Anesthesia Postprocedure Evaluation (Signed)
 Anesthesia Post Note  Patient: Adam Watson  Procedure(s) Performed: ARTHROPLASTY, HIP, TOTAL, ANTERIOR APPROACH (Right: Hip)     Patient location during evaluation: PACU Anesthesia Type: Regional, MAC and Spinal Level of consciousness: awake and alert and oriented Pain management: pain level controlled Vital Signs Assessment: post-procedure vital signs reviewed and stable Respiratory status: spontaneous breathing, nonlabored ventilation and respiratory function stable Cardiovascular status: blood pressure returned to baseline and stable Postop Assessment: no headache, no backache, spinal receding and no apparent nausea or vomiting Anesthetic complications: no   No notable events documented.  Last Vitals:  Vitals:   10/08/23 1030 10/08/23 1045  BP: 131/77 126/84  Pulse: 62 63  Resp: 12 12  Temp:    SpO2: 97% 96%    Last Pain:  Vitals:   10/08/23 1045  TempSrc:   PainSc: 0-No pain    LLE Motor Response: Purposeful movement (10/08/23 1045) LLE Sensation: Numbness (10/08/23 1045) RLE Motor Response: Purposeful movement (10/08/23 1045) RLE Sensation: Numbness (10/08/23 1045) L Sensory Level: L4-Anterior knee, lower leg (10/08/23 1045) R Sensory Level: L4-Anterior knee, lower leg (10/08/23 1045)  Almarie HERO [place]

## 2023-10-09 DIAGNOSIS — M1611 Unilateral primary osteoarthritis, right hip: Secondary | ICD-10-CM | POA: Diagnosis not present

## 2023-10-09 LAB — CBC
HCT: 42.3 % (ref 39.0–52.0)
Hemoglobin: 14.9 g/dL (ref 13.0–17.0)
MCH: 33.6 pg (ref 26.0–34.0)
MCHC: 35.2 g/dL (ref 30.0–36.0)
MCV: 95.5 fL (ref 80.0–100.0)
Platelets: 233 K/uL (ref 150–400)
RBC: 4.43 MIL/uL (ref 4.22–5.81)
RDW: 12.5 % (ref 11.5–15.5)
WBC: 15.2 K/uL — ABNORMAL HIGH (ref 4.0–10.5)
nRBC: 0 % (ref 0.0–0.2)

## 2023-10-09 LAB — BASIC METABOLIC PANEL WITH GFR
Anion gap: 10 (ref 5–15)
BUN: 12 mg/dL (ref 8–23)
CO2: 24 mmol/L (ref 22–32)
Calcium: 8.9 mg/dL (ref 8.9–10.3)
Chloride: 99 mmol/L (ref 98–111)
Creatinine, Ser: 0.94 mg/dL (ref 0.61–1.24)
GFR, Estimated: >60 mL/min (ref >60)
Glucose, Bld: 148 mg/dL — ABNORMAL HIGH (ref 70–99)
Potassium: 3.8 mmol/L (ref 3.5–5.1)
Sodium: 133 mmol/L — ABNORMAL LOW (ref 135–145)

## 2023-10-09 MED ORDER — METHOCARBAMOL 500 MG PO TABS: 500.0000 mg | ORAL_TABLET | Freq: Four times a day (QID) | ORAL | 1 refills | Status: DC | PRN

## 2023-10-09 MED ORDER — OXYCODONE HCL 5 MG PO TABS: 5.0000 mg | ORAL_TABLET | Freq: Four times a day (QID) | ORAL | 0 refills | Status: DC | PRN

## 2023-10-09 MED ORDER — ASPIRIN 81 MG PO CHEW: 81.0000 mg | CHEWABLE_TABLET | Freq: Two times a day (BID) | ORAL | 0 refills | Status: AC

## 2023-10-09 MED ORDER — MUPIROCIN 2 % EX OINT: 1.0000 | TOPICAL_OINTMENT | Freq: Two times a day (BID) | CUTANEOUS | 0 refills | Status: AC

## 2023-10-09 MED ORDER — CHLORHEXIDINE GLUCONATE 4 % EX SOLN: 1.0000 | CUTANEOUS | 1 refills | Status: AC

## 2023-10-09 NOTE — Discharge Instructions (Signed)

## 2023-10-09 NOTE — TOC Transition Note (Addendum)
 Transition of Care [facility]) - Discharge Note   Patient Details  Name: Adam Watson MRN: [id number] Date of Birth: 10/21/1960  Transition of Care [facility]) CM/SW Contact:  Heather DELENA Saltness, LCSW Phone Number: 10/09/2023, 10:26 AM   Clinical Narrative:     ADDENDUM  10:56 AM - CSW spoke with Jermaine at [facility] who reports they cannot assist with RW because pt has HMO insurance plan. CSW reached out to Aida at [employer], who reports ability to deliver RW.  Pt to discharge home with [facility] through [facility]. RW ordered through [employer]. RW to be delivered to bedside. No further TOC needs at this time.   Final next level of care: Home w Home Health Services Barriers to Discharge: Barriers Resolved   Patient Goals and CMS Choice Patient states their goals for this hospitalization and ongoing recovery are:: To return home        Discharge Placement  Home with [facility] services                Name of family member notified: Patient    Discharge Plan and Services Additional resources added to the After Visit Summary for  [facility] PT with [facility]                DME Arranged: Vannie rolling DME Agency: [employer] Date DME Agency Contacted: 10/09/23 Time DME Agency Contacted: 1026 Representative spoke with at DME Agency: [place] HH Arranged: PT HH Agency: [facility] Date [facility] Agency Contacted: 10/09/23 Time HH Agency Contacted: 0940 Representative spoke with at [facility] Agency: Arna  Social Drivers of Health (SDOH) Interventions SDOH Screenings   Food Insecurity: No Food Insecurity (10/08/2023)  Housing: Low Risk  (10/08/2023)  Transportation Needs: No Transportation Needs (10/08/2023)  Utilities: Not At Risk (10/08/2023)  Financial Resource Strain: Low Risk  (08/25/2023)   Received from [facility]  Tobacco Use: Low Risk  (10/08/2023)     Readmission Risk Interventions     No data to display           Heather Saltness, MSW, LCSW 10/09/2023 10:26 AM

## 2023-10-09 NOTE — Progress Notes (Signed)
 Physical Therapy Treatment Patient Details Name: Adam Watson MRN: [id number] DOB: Dec 19, 1960 Today's Date: 10/09/2023   History of Present Illness Pt s/p R THR and with hx of Wolff-Parkinson-White Syndrome, R TKR and cervical decompression.    PT Comments  Pt motivated and progressing well with mobility.  Therex program initiated and pt up to ambulate increased distance in hall.  Will follow up with stair training.  Pt eager for dc home this date.    If plan is discharge home, recommend the following: A little help with walking and/or transfers;A little help with bathing/dressing/bathroom;Assistance with cooking/housework;Assist for transportation;Help with stairs or ramp for entrance   Can travel by private vehicle        Equipment Recommendations  Rolling walker (2 wheels)    Recommendations for Other Services       Precautions / Restrictions Precautions Precautions: Fall Restrictions Weight Bearing Restrictions Per Provider Order: Yes RLE Weight Bearing Per Provider Order: Weight bearing as tolerated     Mobility  Bed Mobility Overal bed mobility: Needs Assistance Bed Mobility: Supine to Sit     Supine to sit: Min assist     General bed mobility comments: cues for sequence and use of L LE to self assist    Transfers Overall transfer level: Needs assistance Equipment used: Rolling walker (2 wheels) Transfers: Sit to/from Stand Sit to Stand: Contact guard assist           General transfer comment: cues for LE management and use of UEs to self assist    Ambulation/Gait Ambulation/Gait assistance: Contact guard assist Gait Distance (Feet): 130 Feet Assistive device: Rolling walker (2 wheels) Gait Pattern/deviations: Step-to pattern, Decreased step length - right, Decreased step length - left, Shuffle, Trunk flexed Gait velocity: decr     General Gait Details: cues for sequence, posture and position from [employer]             Wheelchair  Mobility     Tilt Bed    Modified Rankin (Stroke Patients Only)       Balance Overall balance assessment: Needs assistance Sitting-balance support: No upper extremity supported, Feet supported Sitting balance-Leahy Scale: Good     Standing balance support: No upper extremity supported Standing balance-Leahy Scale: Fair                              Hotel manager: No apparent difficulties  Cognition Arousal: Alert Behavior During Therapy: WFL for tasks assessed/performed   PT - Cognitive impairments: No apparent impairments                         Following commands: Intact      Cueing Cueing Techniques: Verbal cues  Exercises Total Joint Exercises Ankle Circles/Pumps: AROM, Both, 15 reps, Supine Quad Sets: AROM, Both, 10 reps, Supine Heel Slides: AAROM, Right, 20 reps, Supine Hip ABduction/ADduction: AAROM, Right, 15 reps, Supine    General Comments        Pertinent Vitals/Pain Pain Assessment Pain Assessment: 0-10 Pain Score: 5  Pain Location: R hip Pain Descriptors / Indicators: Aching, Sore Pain Intervention(s): Limited activity within patient's tolerance, Monitored during session, Premedicated before session, Ice applied    Home Living                          Prior Function  PT Goals (current goals can now be found in the care plan section) Acute Rehab PT Goals Patient Stated Goal: Regain IND PT Goal Formulation: With patient Time For Goal Achievement: 10/15/23 Potential to Achieve Goals: Good Progress towards PT goals: Progressing toward goals    Frequency    7X/week      PT Plan      Co-evaluation              AM-PAC PT 6 Clicks Mobility   Outcome Measure  Help needed turning from your back to your side while in a flat bed without using bedrails?: A Little Help needed moving from lying on your back to sitting on the side of a flat bed without using  bedrails?: A Little Help needed moving to and from a bed to a chair (including a wheelchair)?: A Little Help needed standing up from a chair using your arms (e.g., wheelchair or bedside chair)?: A Little Help needed to walk in hospital room?: A Little Help needed climbing 3-5 steps with a railing? : A Lot 6 Click Score: 17    End of Session Equipment Utilized During Treatment: Gait belt Activity Tolerance: Patient tolerated treatment well Patient left: in chair;with call bell/phone within reach Nurse Communication: Mobility status PT Visit Diagnosis: Difficulty in walking, not elsewhere classified (R26.2)     Time: 9166-9142 PT Time Calculation (min) (ACUTE ONLY): 24 min  Charges:    $Gait Training: 8-22 mins $Therapeutic Exercise: 8-22 mins PT General Charges $$ ACUTE PT VISIT: 1 Visit                     [facility] PT Acute Rehabilitation Services Office [phone]    Laylaa Guevarra 10/09/2023, 9:16 AM

## 2023-10-09 NOTE — Care Management Obs Status (Signed)
 MEDICARE OBSERVATION STATUS NOTIFICATION   Patient Details  Name: Adam Watson MRN: [id number] Date of Birth: Apr 12, 1960   Medicare Observation Status Notification Given:   Yes    Akyia Borelli A Brannon Levene, LCSW 10/09/2023, 9:17 AM

## 2023-10-09 NOTE — Progress Notes (Signed)
 Physical Therapy Treatment Patient Details Name: Adam Watson MRN: [id number] DOB: June 16, 1960 Today's Date: 10/09/2023   History of Present Illness Pt s/p R THR and with hx of Wolff-Parkinson-White Syndrome, R TKR and cervical decompression.    PT Comments  Pt continues cooperative and progressing well with mobility.  Pt up to ambulate in hall, negotiated stairs, reviewed car transfers and with questions asked and answered.  Spouse present for session.  Pt eager for dc home this date.    If plan is discharge home, recommend the following: A little help with walking and/or transfers;A little help with bathing/dressing/bathroom;Assistance with cooking/housework;Assist for transportation;Help with stairs or ramp for entrance   Can travel by private vehicle        Equipment Recommendations  Rolling walker (2 wheels)    Recommendations for Other Services       Precautions / Restrictions Precautions Precautions: Fall Restrictions Weight Bearing Restrictions Per Provider Order: Yes RLE Weight Bearing Per Provider Order: Weight bearing as tolerated     Mobility  Bed Mobility Overal bed mobility: Needs Assistance Bed Mobility: Sit to Supine     Supine to sit: Min assist Sit to supine: Min assist   General bed mobility comments: cues for sequence and use of L LE to self assist    Transfers Overall transfer level: Needs assistance Equipment used: Rolling walker (2 wheels) Transfers: Sit to/from Stand Sit to Stand: Contact guard assist, Supervision           General transfer comment: cues for LE management and use of UEs to self assist    Ambulation/Gait Ambulation/Gait assistance: Contact guard assist, Supervision Gait Distance (Feet): 120 Feet Assistive device: Rolling walker (2 wheels) Gait Pattern/deviations: Step-to pattern, Decreased step length - right, Decreased step length - left, Shuffle, Trunk flexed Gait velocity: decr     General Gait Details: cues for  sequence, posture and position from RW   Stairs Stairs: Yes Stairs assistance: Min assist Stair Management: No rails, One rail Left, Step to pattern, Forwards, With walker, With cane Number of Stairs: 4 General stair comments: single step twice with RW; 2 steps with rail and cane; cues for sequence   Wheelchair Mobility     Tilt Bed    Modified Rankin (Stroke Patients Only)       Balance Overall balance assessment: Needs assistance Sitting-balance support: No upper extremity supported, Feet supported Sitting balance-Leahy Scale: Good     Standing balance support: No upper extremity supported Standing balance-Leahy Scale: Fair                              Hotel manager: No apparent difficulties  Cognition Arousal: Alert Behavior During Therapy: WFL for tasks assessed/performed   PT - Cognitive impairments: No apparent impairments                         Following commands: Intact      Cueing Cueing Techniques: Verbal cues  Exercises Total Joint Exercises Ankle Circles/Pumps: AROM, Both, 15 reps, Supine Quad Sets: AROM, Both, 10 reps, Supine Heel Slides: AAROM, Right, 20 reps, Supine Hip ABduction/ADduction: AAROM, Right, 15 reps, Supine    General Comments        Pertinent Vitals/Pain Pain Assessment Pain Assessment: 0-10 Pain Score: 5  Pain Location: R hip Pain Descriptors / Indicators: Aching, Sore Pain Intervention(s): Limited activity within patient's tolerance, Monitored during session, Premedicated before  session    Home Living                          Prior Function            PT Goals (current goals can now be found in the care plan section) Acute Rehab PT Goals Patient Stated Goal: Regain IND PT Goal Formulation: With patient Time For Goal Achievement: 10/15/23 Potential to Achieve Goals: Good Progress towards PT goals: Progressing toward goals    Frequency     7X/week      PT Plan      Co-evaluation              AM-PAC PT 6 Clicks Mobility   Outcome Measure  Help needed turning from your back to your side while in a flat bed without using bedrails?: A Little Help needed moving from lying on your back to sitting on the side of a flat bed without using bedrails?: A Little Help needed moving to and from a bed to a chair (including a wheelchair)?: A Little Help needed standing up from a chair using your arms (e.g., wheelchair or bedside chair)?: A Little Help needed to walk in hospital room?: A Little Help needed climbing 3-5 steps with a railing? : A Little 6 Click Score: 18    End of Session Equipment Utilized During Treatment: Gait belt Activity Tolerance: Patient tolerated treatment well Patient left: in bed;with call bell/phone within reach;with family/visitor present Nurse Communication: Mobility status PT Visit Diagnosis: Difficulty in walking, not elsewhere classified (R26.2)     Time: 8787-8751 PT Time Calculation (min) (ACUTE ONLY): 36 min  Charges:    $Gait Training: 8-22 mins $Therapeutic Activity: 8-22 mins PT General Charges $$ ACUTE PT VISIT: 1 Visit                     [facility] PT Acute Rehabilitation Services Office [phone]    Jadie Allington 10/09/2023, 1:03 PM

## 2023-10-09 NOTE — Progress Notes (Signed)
 Subjective: 1 Day Post-Op Procedure(s) (LRB): ARTHROPLASTY, HIP, TOTAL, ANTERIOR APPROACH (Right) Patient reports pain as moderate.    Objective: Vital signs in last 24 hours: Temp:  [97.8 F (36.6 C)-98.7 F (37.1 C)] 98 F (36.7 C) (07/19 0528) Pulse Rate:  [62-96] 80 (07/19 0528) Resp:  [10-18] 16 (07/19 0528) BP: (114-143)/(72-92) 126/78 (07/19 0528) SpO2:  [93 %-97 %] 95 % (07/19 0528)  Intake/Output from previous day: 07/18 0701 - 07/19 0700 In: 3266.1 [P.O.:720; I.V.:2146.1; IV Piggyback:400] Out: 3750 [Urine:3400; Blood:350] Intake/Output this shift: No intake/output data recorded.  Recent Labs    10/09/23 0314  HGB 14.9   Recent Labs    10/09/23 0314  WBC 15.2*  RBC 4.43  HCT 42.3  PLT 233   Recent Labs    10/09/23 0314  NA 133*  K 3.8  CL 99  CO2 24  BUN 12  CREATININE 0.94  GLUCOSE 148*  CALCIUM 8.9   No results for input(s): LABPT, INR in the last 72 hours.  Sensation intact distally Intact pulses distally Dorsiflexion/Plantar flexion intact Incision: dressing C/D/I   Assessment/Plan: 1 Day Post-Op Procedure(s) (LRB): ARTHROPLASTY, HIP, TOTAL, ANTERIOR APPROACH (Right) Up with therapy Discharge home with home health this afternoon.      Lonni CINDERELLA Poli 10/09/2023, 10:23 AM

## 2023-10-10 NOTE — Discharge Summary (Signed)
 Patient ID: Adam Watson MRN: [id number] DOB/AGE: 1960-07-02 63 y.o.  Admit date: 10/08/2023 Discharge date: 10/09/2023  Admission Diagnoses:  Principal Problem:   Unilateral primary osteoarthritis, right hip Active Problems:   Status post total replacement of right hip   Discharge Diagnoses:  Same  Past Medical History:  Diagnosis Date   Arthritis    Bicuspid aortic valve    COPD (chronic obstructive pulmonary disease) (HCC)    Diarrhea    hx   GERD (gastroesophageal reflux disease)    History of kidney stones    History of stomach ulcers    Hypertension    no meds   PONV (postoperative nausea and vomiting)    Post-traumatic osteoarthritis of right knee 04/07/2016   Pulmonary nodules    Wart    inside my nose (04/20/2016)   WPW (Wolff-Parkinson-White syndrome)     Surgeries: Procedure(s): ARTHROPLASTY, HIP, TOTAL, ANTERIOR APPROACH on 10/08/2023   Consultants:   Discharged Condition: Improved  Hospital Course: Adam Watson is an 63 y.o. male who was admitted 10/08/2023 for operative treatment ofUnilateral primary osteoarthritis, right hip. Patient has severe unremitting pain that affects sleep, daily activities, and work/hobbies. After pre-op clearance the patient was taken to the operating room on 10/08/2023 and underwent  Procedure(s): ARTHROPLASTY, HIP, TOTAL, ANTERIOR APPROACH.    Patient was given perioperative antibiotics:  Anti-infectives (From admission, onward)    Start     Dose/Rate Route Frequency Ordered Stop   10/08/23 1400  ceFAZolin  (ANCEF ) IVPB 2g/100 mL premix        2 g 200 mL/hr over 30 Minutes Intravenous Every 6 hours 10/08/23 1119 10/08/23 2218   10/08/23 0600  ceFAZolin  (ANCEF ) IVPB 3g/150 mL premix        3 g 300 mL/hr over 30 Minutes Intravenous On call to O.R. 10/08/23 9453 10/08/23 0743        Patient was given sequential compression devices, early ambulation, and chemoprophylaxis to prevent DVT.  Patient benefited maximally from  hospital stay and there were no complications.    Recent vital signs: No data found.   Recent laboratory studies:  Recent Labs    10/09/23 0314  WBC 15.2*  HGB 14.9  HCT 42.3  PLT 233  NA 133*  K 3.8  CL 99  CO2 24  BUN 12  CREATININE 0.94  GLUCOSE 148*  CALCIUM 8.9     Discharge Medications:   Allergies as of 10/09/2023       Reactions   Codeine Other (See Comments)   Eyes burning   Penicillins Hives, Rash   Other Nausea And Vomiting   General anesthesia    Tetracyclines & Related Other (See Comments)   Burning/Eye itching        Medication List     TAKE these medications    aspirin  81 MG chewable tablet Chew 1 tablet (81 mg total) by mouth 2 (two) times daily.   chlorhexidine  4 % external liquid Commonly known as: HIBICLENS  Apply 15 mLs (1 Application total) topically as directed for 30 doses. Use as directed daily for 5 days every other week for 6 weeks.   gabapentin  600 MG tablet Commonly known as: NEURONTIN  Take 600 mg by mouth 3 (three) times daily.   ibuprofen 800 MG tablet Commonly known as: ADVIL Take 800 mg by mouth 3 (three) times daily.   losartan -hydrochlorothiazide  50-12.5 MG tablet Commonly known as: HYZAAR Take 1 tablet by mouth daily.   methocarbamol  500 MG tablet Commonly known as:  ROBAXIN  Take 1 tablet (500 mg total) by mouth every 6 (six) hours as needed for muscle spasms.   mupirocin  ointment 2 % Commonly known as: BACTROBAN  Place 1 Application into the nose 2 (two) times daily for 60 doses. Use as directed 2 times daily for 5 days every other week for 6 weeks.   oxyCODONE  5 MG immediate release tablet Commonly known as: Oxy IR/ROXICODONE  Take 1-2 tablets (5-10 mg total) by mouth every 6 (six) hours as needed for moderate pain (pain score 4-6) (pain score 4-6). No more than 6 tablets per day.   pantoprazole  40 MG tablet Commonly known as: PROTONIX  Take 40 mg by mouth daily.   sildenafil  50 MG tablet Commonly known as:  Viagra  Take 1 tablet (50 mg total) by mouth daily as needed for erectile dysfunction.   spironolactone  50 MG tablet Commonly known as: ALDACTONE  Take 50 mg by mouth daily.        Diagnostic Studies: DG Pelvis Portable Result Date: 10/08/2023 CLINICAL DATA:  Status post right hip replacement. EXAM: PORTABLE PELVIS 1-2 VIEWS COMPARISON:  None Available. FINDINGS: Right acetabular and femoral components are well situated. Expected postoperative changes are noted in the surrounding soft tissues. IMPRESSION: Status post right total hip arthroplasty. Electronically Signed   By: Lynwood Landy Raddle M.D.   On: 10/08/2023 09:41   DG HIP UNILAT WITH PELVIS 1V RIGHT Result Date: 10/08/2023 CLINICAL DATA:  Right total hip arthroplasty. EXAM: DG HIP (WITH OR WITHOUT PELVIS) 1V RIGHT COMPARISON:  08/30/2023. FINDINGS: 3 intraoperative fluoroscopic spot views of the right hip are submitted. Interval right total hip arthroplasty. Femoral stem appears well seated. IMPRESSION: Interval right total hip arthroplasty without complicating feature. Electronically Signed   By: Newell Eke M.D.   On: 10/08/2023 08:48   DG C-Arm 1-60 Min-No Report Result Date: 10/08/2023 Fluoroscopy was utilized by the requesting physician.  No radiographic interpretation.   DG C-Arm 1-60 Min-No Report Result Date: 10/08/2023 Fluoroscopy was utilized by the requesting physician.  No radiographic interpretation.    Disposition: Discharge disposition: 01-Home or Self Care          Follow-up Information     Vernetta Lonni GRADE, MD Follow up in 2 week(s).   Specialty: Orthopedic Surgery Contact information: [address] [place]  [place] [address] [phone]         [place], [facility]. Call.   Specialty: Home Health Services Why: Please follow up with this provider to begin home health PT services upon discharge. Contact information: [address] [place]  [address] [phone]                  Signed: Lonni GRADE Vernetta 10/10/2023, 7:32 AM

## 2023-10-11 ENCOUNTER — Encounter (HOSPITAL_COMMUNITY): Payer: Self-pay | Admitting: Orthopaedic Surgery

## 2023-10-12 ENCOUNTER — Telehealth: Payer: Self-pay | Admitting: Orthopaedic Surgery

## 2023-10-12 NOTE — Telephone Encounter (Signed)
 Chris (PTA) called from [facility] with FYI. Pt concerned redness behind incision and post side of dressing. No fever pain 4-5 out of 10. [place] secure number is [phone]. If need to contact pt [phone]

## 2023-10-21 ENCOUNTER — Telehealth: Payer: Self-pay

## 2023-10-21 NOTE — Telephone Encounter (Signed)
 Patients wife called triage line stating that his bandage is starting to come off, and they are not due to come back in until Monday. Wanting to know what Dr Vernetta wanted him to put in place of the one coming off. Advised to keep incision clean and dry, with no submerging in water. Please call her back and let her know how they should cover his incision until next follow up appt [phone]

## 2023-10-21 NOTE — Telephone Encounter (Signed)
 Patient aware bandage at front desk. Put it at the front desk on [place] side

## 2023-10-25 ENCOUNTER — Ambulatory Visit (INDEPENDENT_AMBULATORY_CARE_PROVIDER_SITE_OTHER): Admitting: Physician Assistant

## 2023-10-25 ENCOUNTER — Encounter: Payer: Self-pay | Admitting: Physician Assistant

## 2023-10-25 DIAGNOSIS — Z96641 Presence of right artificial hip joint: Secondary | ICD-10-CM

## 2023-10-25 MED ORDER — IBUPROFEN 600 MG PO TABS: 600.0000 mg | ORAL_TABLET | Freq: Four times a day (QID) | ORAL | 3 refills | Status: AC | PRN

## 2023-10-25 MED ORDER — METHOCARBAMOL 500 MG PO TABS
500.0000 mg | ORAL_TABLET | Freq: Four times a day (QID) | ORAL | 1 refills | Status: AC | PRN
Start: 2023-10-25 — End: ?

## 2023-10-25 NOTE — Progress Notes (Signed)
 HPI: Mr. Molzahn returns today status post right total hip arthroplasty 10/07/2024.  He states he is overall doing well.  He is carrying a cane today but not really using it.  He has stopped taking normal pain medicines outside of ibuprofen .  He remains on aspirin  for DVT prophylaxis.  He is asking about his gabapentin  if he needs to continue to take this as he is having no nerve pain.  This is having some muscle spasm and soreness is asking for refill on his Robaxin .  Negative for fevers chills.  No drainage.  Physical exam: Right hip surgical incisions well-approximated staples no signs of infection wound dehiscence or seroma.  Right calf supple nontender.  Dorsiflexion plantarflexion right ankle intact.  Range of motion of the right hip reveals fluid motion without significant pain.  Impression: Status post right total hip arthroplasty  Plan: Staples harvested today Steri-Strips applied.  Scar tissue mobilization encouraged.  Discussed with him coming off of the gabapentin  and how he can taper down from taking 600 mg 3 times daily.  Refill on his ibuprofen  is given also refill on Robaxin  given.  He will remain on 81 mg aspirin  once daily for another week and then discontinue as he was on no aspirin  prior to surgery.  Follow-up with us  in 1 month sooner if there is any questions or concerns.

## 2023-11-24 ENCOUNTER — Encounter: Payer: Self-pay | Admitting: Orthopaedic Surgery

## 2023-11-24 ENCOUNTER — Ambulatory Visit (INDEPENDENT_AMBULATORY_CARE_PROVIDER_SITE_OTHER): Admitting: Orthopaedic Surgery

## 2023-11-24 DIAGNOSIS — Z96641 Presence of right artificial hip joint: Secondary | ICD-10-CM

## 2023-11-24 NOTE — Progress Notes (Signed)
 The patient is now 6 weeks status post her right total hip replacement to treat significant and severe right hip bone-on-bone arthritis.  He says he feels great and has good range of motion and strength and has not felt this good in about 5 years.  His right operative hip is moving smoothly and fluidly.  His left hip has just a touch of stiffness in it and we know from x-rays in the past that left hip is developing arthritis.  Overall though he looks good.  From our standpoint we will see him back in 6 months with a standing AP pelvis.  If there are issues before then he knows to reach out and let us  know.

## 2024-01-24 ENCOUNTER — Encounter: Payer: Self-pay | Admitting: Radiology

## 2024-05-24 ENCOUNTER — Ambulatory Visit: Admitting: Orthopaedic Surgery
